# Patient Record
Sex: Male | Born: 1965 | Race: White | Hispanic: No | Marital: Married | Smoking: Never smoker
Health system: Southern US, Community
[De-identification: ages and names within clinical notes are randomized; demographics above are authoritative.]

---

## 2018-02-26 ENCOUNTER — Inpatient Hospital Stay (HOSPITAL_BASED_OUTPATIENT_CLINIC_OR_DEPARTMENT_OTHER)
Admission: EM | Admit: 2018-02-26 | Discharge: 2018-03-01 | DRG: 419 | Disposition: A | Discharge status: Home / Self Care | Payer: Managed Care, Other (non HMO) | Attending: Surgery | Admitting: Surgery

## 2018-02-26 ENCOUNTER — Emergency Department (HOSPITAL_BASED_OUTPATIENT_CLINIC_OR_DEPARTMENT_OTHER): Payer: Managed Care, Other (non HMO)

## 2018-02-26 ENCOUNTER — Other Ambulatory Visit: Payer: Self-pay

## 2018-02-26 ENCOUNTER — Encounter (HOSPITAL_BASED_OUTPATIENT_CLINIC_OR_DEPARTMENT_OTHER): Payer: Self-pay

## 2018-02-26 DIAGNOSIS — R109 Unspecified abdominal pain: Secondary | ICD-10-CM | POA: Diagnosis present

## 2018-02-26 DIAGNOSIS — K8001 Calculus of gallbladder with acute cholecystitis with obstruction: Principal | ICD-10-CM | POA: Diagnosis present

## 2018-02-26 DIAGNOSIS — R52 Pain, unspecified: Secondary | ICD-10-CM

## 2018-02-26 DIAGNOSIS — K8000 Calculus of gallbladder with acute cholecystitis without obstruction: Secondary | ICD-10-CM

## 2018-02-26 DIAGNOSIS — Z9049 Acquired absence of other specified parts of digestive tract: Secondary | ICD-10-CM

## 2018-02-26 DIAGNOSIS — Z419 Encounter for procedure for purposes other than remedying health state, unspecified: Secondary | ICD-10-CM

## 2018-02-26 DIAGNOSIS — R1013 Epigastric pain: Secondary | ICD-10-CM

## 2018-02-26 LAB — CREATININE, SERUM
Creatinine, Ser: 0.76 mg/dL (ref 0.61–1.24)
GFR calc Af Amer: >60 mL/min (ref >60)

## 2018-02-26 LAB — COMPREHENSIVE METABOLIC PANEL
ALBUMIN: 4.1 g/dL (ref 3.5–5.0)
ALT: 13 U/L (ref 0–44)
ANION GAP: 11 (ref 5–15)
AST: 22 U/L (ref 15–41)
Alkaline Phosphatase: 54 U/L (ref 38–126)
BILIRUBIN TOTAL: 0.4 mg/dL (ref 0.3–1.2)
BUN: 14 mg/dL (ref 6–20)
CALCIUM: 9.1 mg/dL (ref 8.9–10.3)
CO2: 26 mmol/L (ref 22–32)
Chloride: 100 mmol/L (ref 98–111)
Creatinine, Ser: 0.93 mg/dL (ref 0.61–1.24)
GFR calc Af Amer: >60 mL/min (ref >60)
GFR calc non Af Amer: >60 mL/min (ref >60)
GLUCOSE: 141 mg/dL — AB (ref 70–99)
POTASSIUM: 3.9 mmol/L (ref 3.5–5.1)
SODIUM: 137 mmol/L (ref 135–145)
TOTAL PROTEIN: 7.7 g/dL (ref 6.5–8.1)

## 2018-02-26 LAB — CBC
HCT: 42.7 % (ref 39.0–52.0)
HEMATOCRIT: 43.7 % (ref 39.0–52.0)
HEMOGLOBIN: 15.3 g/dL (ref 13.0–17.0)
Hemoglobin: 15.1 g/dL (ref 13.0–17.0)
MCH: 30.1 pg (ref 26.0–34.0)
MCH: 30.2 pg (ref 26.0–34.0)
MCHC: 35.4 g/dL (ref 30.0–36.0)
MCHC: 35 g/dL (ref 30.0–36.0)
MCV: 85.2 fL (ref 78.0–100.0)
MCV: 86.2 fL (ref 78.0–100.0)
PLATELETS: 190 10*3/uL (ref 150–400)
Platelets: 198 10*3/uL (ref 150–400)
RBC: 5.01 MIL/uL (ref 4.22–5.81)
RBC: 5.07 MIL/uL (ref 4.22–5.81)
RDW: 12.5 % (ref 11.5–15.5)
RDW: 12.5 % (ref 11.5–15.5)
WBC: 8.4 10*3/uL (ref 4.0–10.5)
WBC: 11.7 10*3/uL — ABNORMAL HIGH (ref 4.0–10.5)

## 2018-02-26 LAB — LIPASE, BLOOD: Lipase: 29 U/L (ref 11–51)

## 2018-02-26 LAB — TROPONIN I: Troponin I: <0.03 ng/mL (ref <0.03)

## 2018-02-26 MED ORDER — SODIUM CHLORIDE 0.9 % IV BOLUS
1000.0000 mL | Freq: Once | INTRAVENOUS | Status: AC
  Administered 2018-02-26: 1000 mL via INTRAVENOUS

## 2018-02-26 MED ORDER — PANTOPRAZOLE SODIUM 40 MG PO TBEC
40.0000 mg | DELAYED_RELEASE_TABLET | Freq: Every day | ORAL | Status: DC
  Administered 2018-02-26: 40 mg via ORAL
  Filled 2018-02-26: qty 1

## 2018-02-26 MED ORDER — IOPAMIDOL (ISOVUE-370) INJECTION 76%
100.0000 mL | Freq: Once | INTRAVENOUS | Status: AC | PRN
  Administered 2018-02-26: 100 mL via INTRAVENOUS

## 2018-02-26 MED ORDER — SIMETHICONE 80 MG PO CHEW: 40.0000 mg | CHEWABLE_TABLET | Freq: Four times a day (QID) | ORAL | Status: DC | PRN

## 2018-02-26 MED ORDER — ONDANSETRON HCL 4 MG/2ML IJ SOLN
4.0000 mg | Freq: Once | INTRAMUSCULAR | Status: AC
  Administered 2018-02-26: 4 mg via INTRAVENOUS
  Filled 2018-02-26: qty 2

## 2018-02-26 MED ORDER — POLYETHYLENE GLYCOL 3350 17 G PO PACK: 17.0000 g | PACK | Freq: Every day | ORAL | Status: DC | PRN

## 2018-02-26 MED ORDER — DIPHENHYDRAMINE HCL 25 MG PO CAPS: 25.0000 mg | ORAL_CAPSULE | Freq: Four times a day (QID) | ORAL | Status: DC | PRN

## 2018-02-26 MED ORDER — HYDROMORPHONE HCL 1 MG/ML IJ SOLN
1.0000 mg | Freq: Once | INTRAMUSCULAR | Status: AC
  Administered 2018-02-26: 1 mg via INTRAVENOUS
  Filled 2018-02-26: qty 1

## 2018-02-26 MED ORDER — KCL IN DEXTROSE-NACL 20-5-0.45 MEQ/L-%-% IV SOLN
INTRAVENOUS | Status: DC
  Administered 2018-02-26 – 2018-02-27 (×3): via INTRAVENOUS
  Filled 2018-02-26 (×3): qty 1000

## 2018-02-26 MED ORDER — HYDRALAZINE HCL 20 MG/ML IJ SOLN: 10.0000 mg | INTRAMUSCULAR | Status: DC | PRN

## 2018-02-26 MED ORDER — HYDROMORPHONE HCL 1 MG/ML IJ SOLN
0.5000 mg | Freq: Once | INTRAMUSCULAR | Status: AC
  Administered 2018-02-26: 0.5 mg via INTRAVENOUS
  Filled 2018-02-26: qty 1

## 2018-02-26 MED ORDER — ACETAMINOPHEN 325 MG PO TABS: 650.0000 mg | ORAL_TABLET | Freq: Four times a day (QID) | ORAL | Status: DC | PRN

## 2018-02-26 MED ORDER — HYDROMORPHONE HCL 1 MG/ML IJ SOLN
0.5000 mg | INTRAMUSCULAR | Status: DC | PRN
  Administered 2018-02-26: 1 mg via INTRAVENOUS
  Administered 2018-02-26: 0.5 mg via INTRAVENOUS
  Filled 2018-02-26 (×2): qty 1

## 2018-02-26 MED ORDER — ACETAMINOPHEN 650 MG RE SUPP: 650.0000 mg | Freq: Four times a day (QID) | RECTAL | Status: DC | PRN

## 2018-02-26 MED ORDER — ONDANSETRON HCL 4 MG/2ML IJ SOLN: 4.0000 mg | Freq: Four times a day (QID) | INTRAMUSCULAR | Status: DC | PRN

## 2018-02-26 MED ORDER — OXYCODONE HCL 5 MG PO TABS: 5.0000 mg | ORAL_TABLET | ORAL | Status: DC | PRN

## 2018-02-26 MED ORDER — DIPHENHYDRAMINE HCL 50 MG/ML IJ SOLN: 25.0000 mg | Freq: Four times a day (QID) | INTRAMUSCULAR | Status: DC | PRN

## 2018-02-26 MED ORDER — ZOLPIDEM TARTRATE 5 MG PO TABS: 5.0000 mg | ORAL_TABLET | Freq: Every evening | ORAL | Status: DC | PRN

## 2018-02-26 MED ORDER — ONDANSETRON 4 MG PO TBDP: 4.0000 mg | ORAL_TABLET | Freq: Four times a day (QID) | ORAL | Status: DC | PRN

## 2018-02-26 MED ORDER — ENOXAPARIN SODIUM 40 MG/0.4ML ~~LOC~~ SOLN
40.0000 mg | SUBCUTANEOUS | Status: DC
  Administered 2018-02-27: 40 mg via SUBCUTANEOUS
  Filled 2018-02-26: qty 0.4

## 2018-02-26 NOTE — ED Notes (Signed)
ED TO INPATIENT HANDOFF REPORT  Name/Age/Gender Mark Dominguez 52 y.o. male  Code Status   Home/SNF/Other Home  Chief Complaint UPPER ABD PAIN  Level of Care/Admitting Diagnosis ED Disposition    ED Disposition Condition [facility]: [place] [100102]  Level of Care: Med-Surg [16]  Diagnosis: Abdominal pain [409811]  Admitting Physician: [place], [place]  Attending Physician: CCS, MD [[id number]]  Estimated length of stay: past midnight tomorrow  Certification:: I certify this patient will need inpatient services for at least 2 midnights  PT Class (Do Not Modify): Inpatient [101]  PT Acc Code (Do Not Modify): Private [1]       Medical History History reviewed. No pertinent past medical history.  Allergies No Known Allergies  IV Location/Drains/Wounds Patient Lines/Drains/Airways Status   Active Line/Drains/Airways    Name:   Placement date:   Placement time:   Site:   Days:   Peripheral IV 02/26/18 Right Antecubital   02/26/18    0942    Antecubital   less than 1          Labs/Imaging Results for orders placed or performed during the hospital encounter of 02/26/18 (from the past 48 hour(s))  CBC     Status: None   Collection Time: 02/26/18  9:39 AM  Result Value Ref Range   WBC 8.4 4.0 - 10.5 K/uL   RBC 5.01 4.22 - 5.81 MIL/uL   Hemoglobin 15.1 13.0 - 17.0 g/dL   HCT 42.7 39.0 - 52.0 %   MCV 85.2 78.0 - 100.0 fL   MCH 30.1 26.0 - 34.0 pg   MCHC 35.4 30.0 - 36.0 g/dL   RDW 12.5 11.5 - 15.5 %   Platelets 190 150 - 400 K/uL    Comment: Performed at [facility], [address] [place], [place] [address]  Troponin I     Status: None   Collection Time: 02/26/18  9:39 AM  Result Value Ref Range   Troponin I <0.03 <0.03 ng/mL    Comment: Performed at [facility], [address] [place], [place] [address]  Comprehensive metabolic panel     Status: Abnormal   Collection Time: 02/26/18  9:39 AM   Result Value Ref Range   Sodium 137 135 - 145 mmol/L   Potassium 3.9 3.5 - 5.1 mmol/L   Chloride 100 98 - 111 mmol/L   CO2 26 22 - 32 mmol/L   Glucose, Bld 141 (H) 70 - 99 mg/dL   BUN 14 6 - 20 mg/dL   Creatinine, Ser 0.93 0.61 - 1.24 mg/dL   Calcium 9.1 8.9 - 10.3 mg/dL   Total Protein 7.7 6.5 - 8.1 g/dL   Albumin 4.1 3.5 - 5.0 g/dL   AST 22 15 - 41 U/L   ALT 13 0 - 44 U/L   Alkaline Phosphatase 54 38 - 126 U/L   Total Bilirubin 0.4 0.3 - 1.2 mg/dL   GFR calc non Af Amer >60 >60 mL/min   GFR calc Af Amer >60 >60 mL/min    Comment: (NOTE) The eGFR has been calculated using the CKD EPI equation. This calculation has not been validated in all clinical situations. eGFR's persistently <60 mL/min signify possible Chronic Kidney Disease.    Anion gap 11 5 - 15    Comment: Performed at [facility], [address] [place], [place] [address]  Lipase, blood     Status: None  Collection Time: 02/26/18  9:39 AM  Result Value Ref Range   Lipase 29 11 - 51 U/L    Comment: Performed at [facility], [address], [place], [place] [address]   Dg Chest 2 View  Result Date: 02/26/2018 CLINICAL DATA:  Upper abdominal pain radiating to back, epigastric pain. EXAM: CHEST - 2 VIEW COMPARISON:  None. FINDINGS: Linear density at the left lung base, likely lingular scarring or atelectasis. Right lung clear. Heart is normal size. No effusions or acute bony abnormality. IMPRESSION: Minimal left basilar scarring or atelectasis. No acute cardiopulmonary disease. Electronically Signed   By: Rolm Baptise M.D.   On: 02/26/2018 09:45   Ct Angio Chest/abd/pel For Dissection W And/or Wo Contrast  Result Date: 02/26/2018 CLINICAL DATA:  Epigastric chest/abdominal pains that started this morning radiating through to his back, patient is very clammy and appears diaphoretic, no known chest or abdominal/pelvic history, no other complaints^ EXAM: CT ANGIOGRAPHY CHEST, ABDOMEN AND PELVIS  TECHNIQUE: Multidetector CT imaging through the chest, abdomen and pelvis was performed using the standard protocol during bolus administration of intravenous contrast. Multiplanar reconstructed images and MIPs were obtained and reviewed to evaluate the vascular anatomy. CONTRAST:  181m ISOVUE-370 IOPAMIDOL (ISOVUE-370) INJECTION 76% COMPARISON:  None available FINDINGS: CTA CHEST FINDINGS Cardiovascular: Heart size normal. No pericardial effusion. Satisfactory opacification of pulmonary arteries noted, and there is no evidence of pulmonary emboli. No hyperdense crescent involving the aorta on the noncontrast study. Adequate contrast opacification of the thoracic aorta with no evidence of dissection, aneurysm, or stenosis. There is classic 3-vessel brachiocephalic arch anatomy without proximal stenosis. No significant atheromatous plaque. Mediastinum/Nodes: No hilar or mediastinal adenopathy. Lungs/Pleura: No pleural effusion. No pneumothorax. Minimal dependent atelectasis posteriorly in both lower lobes. Lungs otherwise clear. Musculoskeletal: No chest wall abnormality. No acute or significant osseous findings. Review of the MIP images confirms the above findings. CTA ABDOMEN AND PELVIS FINDINGS VASCULAR Aorta: Normal caliber aorta without aneurysm, dissection, vasculitis or significant stenosis. Celiac: Patent without evidence of aneurysm, dissection, vasculitis or significant stenosis. SMA: Patent without evidence of aneurysm, dissection, vasculitis or significant stenosis. Renals: Both renal arteries are patent without evidence of aneurysm, dissection, vasculitis, fibromuscular dysplasia or significant stenosis. IMA: Patent without evidence of aneurysm, dissection, vasculitis or significant stenosis. Inflow: Patent without evidence of aneurysm, dissection, vasculitis or significant stenosis. Veins: No obvious venous abnormality within the limitations of this arterial phase study. Review of the MIP images  confirms the above findings. NON-VASCULAR Hepatobiliary: Unremarkable liver. At least 2 partially calcified stones measuring over 1 cm in the dependent aspect of the nondilated gallbladder. No biliary ductal dilatation. Pancreas: Unremarkable. No pancreatic ductal dilatation or surrounding inflammatory changes. Spleen: Normal in size without focal abnormality. Adrenals/Urinary Tract: Adrenal glands are unremarkable. Kidneys are normal, without renal calculi, focal lesion, or hydronephrosis. Bladder is unremarkable. Stomach/Bowel: Stomach is physiologically distended. The small bowel is nondilated. Normal appendix. Colon is nondilated, unremarkable. Lymphatic: No abdominal or pelvic adenopathy. Reproductive: Prostatic enlargement Other: No ascites.  No free air. Musculoskeletal: Degenerative disc disease L4-5, L5-S1. No fracture or worrisome bone lesion. Review of the MIP images confirms the above findings. IMPRESSION: 1. Negative for aortic dissection or other acute vascular finding. 2. Cholelithiasis without other CT evidence of cholecystitis or biliary obstruction. 3. Lumbar spondylitic changes Electronically Signed   By: DLucrezia EuropeM.D.   On: 02/26/2018 11:01   UKoreaAbdomen Limited Ruq  Result Date: 02/26/2018 CLINICAL DATA:  Epigastric abdominal pain. The patient  is found to have gallstones on CT scan today. EXAM: ULTRASOUND ABDOMEN LIMITED RIGHT UPPER QUADRANT COMPARISON:  CT scan of the chest of today's date FINDINGS: Gallbladder: The gallbladder is adequately distended. There is a gallstone present measuring 1.7 cm in diameter. There is also sludge. There is a positive sonographic Murphy's sign. The gallbladder wall thickness is normal at 2.5 mm. There is no pericholecystic fluid. Common bile duct: Diameter: 3.4 mm Liver: Hepatic echotexture is compatible with fatty infiltrative change. Focal fatty sparing adjacent to the gallbladder is suspected. There is no intrahepatic ductal dilation. Portal vein is  patent on color Doppler imaging with normal direction of blood flow towards the liver. IMPRESSION: Gallstones and sludge. Positive sonographic Murphy's sign. The findings are consistent with cholelithiasis and acute cholecystitis. Increased hepatic echotexture most compatible with fatty infiltrative changes. These results will be called to the ordering clinician or representative by the Radiologist Assistant, and communication documented in the PACS or zVision Dashboard. Electronically Signed   By: David  Martinique M.D.   On: 02/26/2018 12:05    Pending Labs [employer] (From admission, onward)    Start     Ordered   Signed and Held  HIV antibody (Routine Testing)  Once,   R     Signed and Held   Signed and Held  CBC  (enoxaparin (LOVENOX)    CrCl >/= 30 ml/min)  Once,   R    Comments:  Baseline for enoxaparin therapy IF NOT ALREADY DRAWN.  Notify MD if PLT < 100 K.    Signed and Held   Signed and Held  Creatinine, serum  (enoxaparin (LOVENOX)    CrCl >/= 30 ml/min)  Once,   R    Comments:  Baseline for enoxaparin therapy IF NOT ALREADY DRAWN.    Signed and Held   Signed and Held  Creatinine, serum  (enoxaparin (LOVENOX)    CrCl >/= 30 ml/min)  Weekly,   R    Comments:  while on enoxaparin therapy    Signed and Held   Signed and Held  Comprehensive metabolic panel  Tomorrow morning,   R     Signed and Held   Signed and Held  CBC  Tomorrow morning,   R     Signed and Held          Vitals/Pain Today's Vitals   02/26/18 1357 02/26/18 1400 02/26/18 1430 02/26/18 1500  BP: (!) 141/91 (!) 144/84 (!) 143/91 (!) 144/82  Pulse: (!) 59 61 78 64  Resp: _0 Temp: 98.5 F (36.9 C)     TempSrc: Oral     SpO2: 97% 96% 95% 97%  Weight:      Height:      PainSc:        Isolation Precautions No active isolations  Medications Medications  HYDROmorphone (DILAUDID) injection 1 mg (1 mg Intravenous Given 02/26/18 1009)  ondansetron (ZOFRAN) injection 4 mg (4 mg Intravenous Given  02/26/18 1008)  sodium chloride 0.9 % bolus 1,000 mL ( Intravenous Stopped 02/26/18 1125)  iopamidol (ISOVUE-370) 76 % injection 100 mL (100 mLs Intravenous Contrast Given 02/26/18 1037)  HYDROmorphone (DILAUDID) injection 0.5 mg (0.5 mg Intravenous Given 02/26/18 1120)  HYDROmorphone (DILAUDID) injection 0.5 mg (0.5 mg Intravenous Given 02/26/18 1500)    Mobility walks

## 2018-02-26 NOTE — ED Provider Notes (Signed)
Received [facility] in [facility].  He was evaluated there earlier today for sudden onset of epigastric pain.  Work-up revealed cholelithiasis with evidence of acute cholecystitis. On exam, alert and comfortable, epigastric tenderness without peritonitis. Paged general surgery upon patient's arrival.  Gave IV pain medication.  4:04 PM  Surgery has evaluated and will admit for further work up.   Gurbani Figge, Ambrose Finlandachel Morgan, MD 02/26/18 [phone]

## 2018-02-26 NOTE — ED Notes (Signed)
Report given to [facility]. ETA 20 mins 

## 2018-02-26 NOTE — ED Notes (Signed)
Report called to [facility] - Susy FrizzleMatt, RN

## 2018-02-26 NOTE — ED Notes (Signed)
Transporter called to transport patient to IP room

## 2018-02-26 NOTE — H&P (Signed)
Mark Dominguez 10/09/1965  [id number].    Chief Complaint/Reason for Consult: epigastric abdominal pain  HPI:  This is a 52 year old white male with no past medical history who was awakened from sleep this morning at oh 5:30 AM secondary to sharp stabbing epigastric abdominal pain that radiated through to his back.  He did have some nausea and one episode of emesis.  He drove to the store where he got Tums.  He took these and it did not help his pain.  Given his persistent pain he drove himself to [facility] emergency department.  Upon arrival later, his labs were found to be normal.  He had a CT angiogram of his chest, abdomen, and pelvis completed to rule out aortic dissection.  This imaging was normal with an incidental finding of a gallstone.  Given his symptoms and ultrasound was then obtained.  This ultrasound revealed a 1.7 cm gallstone in his gallbladder.  This was not lodged in the neck of the gallbladder.  There was no evidence of pericholecystic fluid or gallbladder wall thickening to suggest cholecystitis.  The patient was then transferred to [facility] emergency department at our request for further evaluation.  The patient states he did have a bowel movement this morning that was otherwise normal.  He denies any history of gastric reflux or ulcer disease.  He states he does not take any medications at home.  He denies any chest pain or shortness of breath.  ROS: ROS: Please see HPI, otherwise all other systems have been reviewed and are negative.  History reviewed. No pertinent family history.  History reviewed. No pertinent past medical history.  History reviewed. No pertinent surgical history.  Social History:  reports that he has never smoked. He has never used smokeless tobacco. He reports that he does not drink alcohol or use drugs.  Allergies: No Known Allergies   (Not in a hospital admission)   Physical Exam: Blood pressure (!) 144/82, pulse 64,  temperature 98.5 F (36.9 C), temperature source Oral, resp. rate 17, height _0  (1.93 m), weight 117.9 kg, SpO2 97 %. General: pleasant, WD, WN, obese white male who is laying in bed in NAD as he has recently had Dilaudid for his pain. HEENT: head is normocephalic, atraumatic.  Sclera are noninjected.  PERRL.  Ears and nose without any masses or lesions.  Mouth is pink and moist Heart: regular, rate, and rhythm.  Normal s1,s2. No obvious murmurs, gallops, or rubs noted.  Palpable radial and pedal pulses bilaterally Lungs: CTAB, no wheezes, rhonchi, or rales noted.  Respiratory effort nonlabored Abd: soft, mild tenderness to deep palpation in his epigastrium, negative Murphy sign, ND/obese, +BS, no masses, hernias, or organomegaly MS: all 4 extremities are symmetrical with no cyanosis, clubbing.  He has +1 to +2 pitting edema in his bilateral lower extremities Skin: warm and dry with no masses, lesions, or rashes.  He has early brawny color changes to his bilateral lower extremities likely secondary to early venous insufficiency changes. Psych: A&Ox3 with an appropriate affect.   Results for orders placed or performed during the hospital encounter of 02/26/18 (from the past 48 hour(s))  CBC     Status: None   Collection Time: 02/26/18  9:39 AM  Result Value Ref Range   WBC 8.4 4.0 - 10.5 K/uL   RBC 5.01 4.22 - 5.81 MIL/uL   Hemoglobin 15.1 13.0 - 17.0 g/dL   HCT 42.7 39.0 - 52.0 %  MCV 85.2 78.0 - 100.0 fL   MCH 30.1 26.0 - 34.0 pg   MCHC 35.4 30.0 - 36.0 g/dL   RDW 12.5 11.5 - 15.5 %   Platelets 190 150 - 400 K/uL    Comment: Performed at [facility], [address] [place], [place] [address]  Troponin I     Status: None   Collection Time: 02/26/18  9:39 AM  Result Value Ref Range   Troponin I <0.03 <0.03 ng/mL    Comment: Performed at [facility], [address] [place], [place] [address]  Comprehensive metabolic panel     Status: Abnormal   Collection  Time: 02/26/18  9:39 AM  Result Value Ref Range   Sodium 137 135 - 145 mmol/L   Potassium 3.9 3.5 - 5.1 mmol/L   Chloride 100 98 - 111 mmol/L   CO2 26 22 - 32 mmol/L   Glucose, Bld 141 (H) 70 - 99 mg/dL   BUN 14 6 - 20 mg/dL   Creatinine, Ser 0.93 0.61 - 1.24 mg/dL   Calcium 9.1 8.9 - 10.3 mg/dL   Total Protein 7.7 6.5 - 8.1 g/dL   Albumin 4.1 3.5 - 5.0 g/dL   AST 22 15 - 41 U/L   ALT 13 0 - 44 U/L   Alkaline Phosphatase 54 38 - 126 U/L   Total Bilirubin 0.4 0.3 - 1.2 mg/dL   GFR calc non Af Amer >60 >60 mL/min   GFR calc Af Amer >60 >60 mL/min    Comment: (NOTE) The eGFR has been calculated using the CKD EPI equation. This calculation has not been validated in all clinical situations. eGFR's persistently <60 mL/min signify possible Chronic Kidney Disease.    Anion gap 11 5 - 15    Comment: Performed at [facility], [address] [place], [place] [address]  Lipase, blood     Status: None   Collection Time: 02/26/18  9:39 AM  Result Value Ref Range   Lipase 29 11 - 51 U/L    Comment: Performed at [facility], [address], [place], [place] [address]   Dg Chest 2 View  Result Date: 02/26/2018 CLINICAL DATA:  Upper abdominal pain radiating to back, epigastric pain. EXAM: CHEST - 2 VIEW COMPARISON:  None. FINDINGS: Linear density at the left lung base, likely lingular scarring or atelectasis. Right lung clear. Heart is normal size. No effusions or acute bony abnormality. IMPRESSION: Minimal left basilar scarring or atelectasis. No acute cardiopulmonary disease. Electronically Signed   By: Rolm Baptise M.D.   On: 02/26/2018 09:45   Ct Angio Chest/abd/pel For Dissection W And/or Wo Contrast  Result Date: 02/26/2018 CLINICAL DATA:  Epigastric chest/abdominal pains that started this morning radiating through to his back, patient is very clammy and appears diaphoretic, no known chest or abdominal/pelvic history, no other complaints^ EXAM: CT ANGIOGRAPHY CHEST,  ABDOMEN AND PELVIS TECHNIQUE: Multidetector CT imaging through the chest, abdomen and pelvis was performed using the standard protocol during bolus administration of intravenous contrast. Multiplanar reconstructed images and MIPs were obtained and reviewed to evaluate the vascular anatomy. CONTRAST:  170m ISOVUE-370 IOPAMIDOL (ISOVUE-370) INJECTION 76% COMPARISON:  None available FINDINGS: CTA CHEST FINDINGS Cardiovascular: Heart size normal. No pericardial effusion. Satisfactory opacification of pulmonary arteries noted, and there is no evidence of pulmonary emboli. No hyperdense crescent involving the aorta on the noncontrast study. Adequate contrast opacification of the thoracic aorta with no evidence  of dissection, aneurysm, or stenosis. There is classic 3-vessel brachiocephalic arch anatomy without proximal stenosis. No significant atheromatous plaque. Mediastinum/Nodes: No hilar or mediastinal adenopathy. Lungs/Pleura: No pleural effusion. No pneumothorax. Minimal dependent atelectasis posteriorly in both lower lobes. Lungs otherwise clear. Musculoskeletal: No chest wall abnormality. No acute or significant osseous findings. Review of the MIP images confirms the above findings. CTA ABDOMEN AND PELVIS FINDINGS VASCULAR Aorta: Normal caliber aorta without aneurysm, dissection, vasculitis or significant stenosis. Celiac: Patent without evidence of aneurysm, dissection, vasculitis or significant stenosis. SMA: Patent without evidence of aneurysm, dissection, vasculitis or significant stenosis. Renals: Both renal arteries are patent without evidence of aneurysm, dissection, vasculitis, fibromuscular dysplasia or significant stenosis. IMA: Patent without evidence of aneurysm, dissection, vasculitis or significant stenosis. Inflow: Patent without evidence of aneurysm, dissection, vasculitis or significant stenosis. Veins: No obvious venous abnormality within the limitations of this arterial phase study. Review of  the MIP images confirms the above findings. NON-VASCULAR Hepatobiliary: Unremarkable liver. At least 2 partially calcified stones measuring over 1 cm in the dependent aspect of the nondilated gallbladder. No biliary ductal dilatation. Pancreas: Unremarkable. No pancreatic ductal dilatation or surrounding inflammatory changes. Spleen: Normal in size without focal abnormality. Adrenals/Urinary Tract: Adrenal glands are unremarkable. Kidneys are normal, without renal calculi, focal lesion, or hydronephrosis. Bladder is unremarkable. Stomach/Bowel: Stomach is physiologically distended. The small bowel is nondilated. Normal appendix. Colon is nondilated, unremarkable. Lymphatic: No abdominal or pelvic adenopathy. Reproductive: Prostatic enlargement Other: No ascites.  No free air. Musculoskeletal: Degenerative disc disease L4-5, L5-S1. No fracture or worrisome bone lesion. Review of the MIP images confirms the above findings. IMPRESSION: 1. Negative for aortic dissection or other acute vascular finding. 2. Cholelithiasis without other CT evidence of cholecystitis or biliary obstruction. 3. Lumbar spondylitic changes Electronically Signed   By: Lucrezia Europe M.D.   On: 02/26/2018 11:01   US Abdomen Limited Ruq  Result Date: 02/26/2018 CLINICAL DATA:  Epigastric abdominal pain. The patient is found to have gallstones on CT scan today. EXAM: ULTRASOUND ABDOMEN LIMITED RIGHT UPPER QUADRANT COMPARISON:  CT scan of the chest of today's date FINDINGS: Gallbladder: The gallbladder is adequately distended. There is a gallstone present measuring 1.7 cm in diameter. There is also sludge. There is a positive sonographic Murphy's sign. The gallbladder wall thickness is normal at 2.5 mm. There is no pericholecystic fluid. Common bile duct: Diameter: 3.4 mm Liver: Hepatic echotexture is compatible with fatty infiltrative change. Focal fatty sparing adjacent to the gallbladder is suspected. There is no intrahepatic ductal dilation.  Portal vein is patent on color Doppler imaging with normal direction of blood flow towards the liver. IMPRESSION: Gallstones and sludge. Positive sonographic Murphy's sign. The findings are consistent with cholelithiasis and acute cholecystitis. Increased hepatic echotexture most compatible with fatty infiltrative changes. These results will be called to the ordering clinician or representative by the Radiologist Assistant, and communication documented in the PACS or zVision Dashboard. Electronically Signed   By: David  Martinique M.D.   On: 02/26/2018 12:05      Assessment/Plan Epigastric abdominal pain The patient woke up this morning with severe epigastric abdominal pain that was stabbing in nature through to his back.  He has had a CT of his chest, abdomen, and pelvis with no significant findings except for a gallstone.  His ultrasound reveals a 1.7 cm gallstone; however, this is not lodged in the neck of his gallbladder.  There are no other signs of acute cholecystitis such as pericholecystic fluid, wall thickening, fever,  or elevated white blood cell count.  The patient could simply have biliary colic, however the expectation would be that this would likely improve after pain medication.  Given his persistent pain, despite no radiologic or laboratory findings for infection, we will admit the patient and obtain a HIDA scan to rule out cholecystitis.  If this is negative for cholecystitis, we will likely obtain a GI consultation to rule out other sources for pain such as ulcer disease or gastritis.  If after gastroenterology consultation and possible EGD were obtained and otherwise negative as well, we would then offer a cholecystectomy with the possibility this may not resolve his pain.  He may have clear liquids tonight as well as pain medicine.  After midnight he will be n.p.o. with no further narcotics until after his HIDA scan.  We will also repeat labs in the morning.  As of now, we will not start  antibiotic therapy as he does not have any clear evidence for an acute infection.  This has been discussed with the patient and he is agreeable to this plan.  FEN - clear liquids, NPO p MN VTE - Lovenox/SCDs ID - none  Henreitta Cea, [facility] Surgery 02/26/2018, 4:14 PM Pager: [phone]

## 2018-02-26 NOTE — ED Provider Notes (Signed)
[facility] EMERGENCY DEPARTMENT Provider Note   CSN: [id number] Arrival date & time: 02/26/18  1191     History   Chief Complaint Chief Complaint  Patient presents with  . Abdominal Pain    HPI Mark Dominguez is a 52 y.o. male.  HPI  52 year old male presents with severe epigastric abdominal pain.  Started this morning at 5:30 AM and woke him from sleep.  He also has some pain in his back at a similar level.  Has had nausea without vomiting.  Has felt flushed but has not had an obvious fever.  No chest pain or shortness of breath.  No constipation or diarrhea.  He tried some Tums without relief.  Has never had this before.  Denies any significant past medical history including no smoking, alcohol use, or drug use.  History reviewed. No pertinent past medical history.  There are no active problems to display for this patient.   History reviewed. No pertinent surgical history.      Home Medications    Prior to Admission medications   Not on File    Family History No family history on file.  Social History Social History   Tobacco Use  . Smoking status: Never Smoker  . Smokeless tobacco: Never Used  Substance Use Topics  . Alcohol use: Never    Frequency: Never  . Drug use: Never     Allergies   Patient has no known allergies.   Review of Systems Review of Systems  Constitutional: Negative for chills and fever.  Respiratory: Negative for shortness of breath.   Cardiovascular: Negative for chest pain.  Gastrointestinal: Positive for abdominal pain and nausea. Negative for constipation, diarrhea and vomiting.  Musculoskeletal: Positive for back pain.  All other systems reviewed and are negative.    Physical Exam Updated Vital Signs BP (!) 142/82   Pulse (!) 55   Temp 98 F (36.7 C) (Oral)   Resp 13   Ht 6\' 4"  (1.93 m)   Wt 117.9 kg   SpO2 95%   BMI 31.65 kg/m   Physical Exam  Constitutional: He is oriented to person, place, and  time. He appears well-developed and well-nourished.  Non-toxic appearance. He does not appear ill.  HENT:  Head: Normocephalic and atraumatic.  Right Ear: External ear normal.  Left Ear: External ear normal.  Nose: Nose normal.  Eyes: Right eye exhibits no discharge. Left eye exhibits no discharge.  Neck: Neck supple.  Cardiovascular: Normal rate, regular rhythm and normal heart sounds.  Pulses:      Radial pulses are 2+ on the right side, and 2+ on the left side.  Pulmonary/Chest: Effort normal and breath sounds normal. He exhibits no tenderness.  Abdominal: Soft. There is tenderness (mild) in the right upper quadrant and epigastric area.  Musculoskeletal: He exhibits no edema.  Neurological: He is alert and oriented to person, place, and time.  Skin: Skin is warm and dry.  Nursing note and vitals reviewed.    ED Treatments / Results  Labs (all labs ordered are listed, but only abnormal results are displayed) Labs Reviewed  COMPREHENSIVE METABOLIC PANEL - Abnormal; Notable for the following components:      Result Value   Glucose, Bld 141 (*)    All other components within normal limits  CBC  TROPONIN I  LIPASE, BLOOD    EKG EKG Interpretation  Date/Time:  Monday February 26 2018 09:28:10 EDT Ventricular Rate:  57 PR Interval:  QRS Duration: 100 QT Interval:  397 QTC Calculation: 387 R Axis:   63 Text Interpretation:  Sinus rhythm Baseline wander in lead(s) V4 no acute ST/T changes No old tracing to compare Confirmed by Pricilla Loveless [phone]) on 02/26/2018 9:33:57 AM Also confirmed by Pricilla Loveless [phone]), editor Barbette Hair [phone])  on 02/26/2018 11:09:58 AM   Radiology Dg Chest 2 View  Result Date: 02/26/2018 CLINICAL DATA:  Upper abdominal pain radiating to back, epigastric pain. EXAM: CHEST - 2 VIEW COMPARISON:  None. FINDINGS: Linear density at the left lung base, likely lingular scarring or atelectasis. Right lung clear. Heart is normal size. No  effusions or acute bony abnormality. IMPRESSION: Minimal left basilar scarring or atelectasis. No acute cardiopulmonary disease. Electronically Signed   By: Charlett Nose M.D.   On: 02/26/2018 09:45   Ct Angio Chest/abd/pel For Dissection W And/or Wo Contrast  Result Date: 02/26/2018 CLINICAL DATA:  Epigastric chest/abdominal pains that started this morning radiating through to his back, patient is very clammy and appears diaphoretic, no known chest or abdominal/pelvic history, no other complaints^ EXAM: CT ANGIOGRAPHY CHEST, ABDOMEN AND PELVIS TECHNIQUE: Multidetector CT imaging through the chest, abdomen and pelvis was performed using the standard protocol during bolus administration of intravenous contrast. Multiplanar reconstructed images and MIPs were obtained and reviewed to evaluate the vascular anatomy. CONTRAST:  ISOVUE-370 IOPAMIDOL (ISOVUE-370) INJECTION 76% COMPARISON:  None available FINDINGS: CTA CHEST FINDINGS Cardiovascular: Heart size normal. No pericardial effusion. Satisfactory opacification of pulmonary arteries noted, and there is no evidence of pulmonary emboli. No hyperdense crescent involving the aorta on the noncontrast study. Adequate contrast opacification of the thoracic aorta with no evidence of dissection, aneurysm, or stenosis. There is classic 3-vessel brachiocephalic arch anatomy without proximal stenosis. No significant atheromatous plaque. Mediastinum/Nodes: No hilar or mediastinal adenopathy. Lungs/Pleura: No pleural effusion. No pneumothorax. Minimal dependent atelectasis posteriorly in both lower lobes. Lungs otherwise clear. Musculoskeletal: No chest wall abnormality. No acute or significant osseous findings. Review of the MIP images confirms the above findings. CTA ABDOMEN AND PELVIS FINDINGS VASCULAR Aorta: Normal caliber aorta without aneurysm, dissection, vasculitis or significant stenosis. Celiac: Patent without evidence of aneurysm, dissection, vasculitis or  significant stenosis. SMA: Patent without evidence of aneurysm, dissection, vasculitis or significant stenosis. Renals: Both renal arteries are patent without evidence of aneurysm, dissection, vasculitis, fibromuscular dysplasia or significant stenosis. IMA: Patent without evidence of aneurysm, dissection, vasculitis or significant stenosis. Inflow: Patent without evidence of aneurysm, dissection, vasculitis or significant stenosis. Veins: No obvious venous abnormality within the limitations of this arterial phase study. Review of the MIP images confirms the above findings. NON-VASCULAR Hepatobiliary: Unremarkable liver. At least 2 partially calcified stones measuring over 1 cm in the dependent aspect of the nondilated gallbladder. No biliary ductal dilatation. Pancreas: Unremarkable. No pancreatic ductal dilatation or surrounding inflammatory changes. Spleen: Normal in size without focal abnormality. Adrenals/Urinary Tract: Adrenal glands are unremarkable. Kidneys are normal, without renal calculi, focal lesion, or hydronephrosis. Bladder is unremarkable. Stomach/Bowel: Stomach is physiologically distended. The small bowel is nondilated. Normal appendix. Colon is nondilated, unremarkable. Lymphatic: No abdominal or pelvic adenopathy. Reproductive: Prostatic enlargement Other: No ascites.  No free air. Musculoskeletal: Degenerative disc disease L4-5, L5-S1. No fracture or worrisome bone lesion. Review of the MIP images confirms the above findings. IMPRESSION: 1. Negative for aortic dissection or other acute vascular finding. 2. Cholelithiasis without other CT evidence of cholecystitis or biliary obstruction. 3. Lumbar spondylitic changes Electronically Signed   By: Ronald Pippins.D.  On: 02/26/2018 11:01   [place] Abdomen Limited Ruq  Result Date: 02/26/2018 CLINICAL DATA:  Epigastric abdominal pain. The patient is found to have gallstones on CT scan today. EXAM: ULTRASOUND ABDOMEN LIMITED RIGHT UPPER QUADRANT  COMPARISON:  CT scan of the chest of today's date FINDINGS: Gallbladder: The gallbladder is adequately distended. There is a gallstone present measuring 1.7 cm in diameter. There is also sludge. There is a positive sonographic Murphy's sign. The gallbladder wall thickness is normal at 2.5 mm. There is no pericholecystic fluid. Common bile duct: Diameter: 3.4 mm Liver: Hepatic echotexture is compatible with fatty infiltrative change. Focal fatty sparing adjacent to the gallbladder is suspected. There is no intrahepatic ductal dilation. Portal vein is patent on color Doppler imaging with normal direction of blood flow towards the liver. IMPRESSION: Gallstones and sludge. Positive sonographic Murphy's sign. The findings are consistent with cholelithiasis and acute cholecystitis. Increased hepatic echotexture most compatible with fatty infiltrative changes. These results will be called to the ordering clinician or representative by the Radiologist Assistant, and communication documented in the PACS or zVision Dashboard. Electronically Signed   By: David  Swazil[place] M.D.   On: 02/26/2018 12:05    Procedures Procedures (including critical care time)  Medications Ordered in ED Medications  HYDROmorphone (DILAUDID) injection 1 mg (1 mg Intravenous Given 02/26/18 1009)  ondansetron (ZOFRAN) injection 4 mg (4 mg Intravenous Given 02/26/18 1008)  sodium chloride 0.9 % bolus 1,000 mL ( Intravenous Stopped 02/26/18 1125)  iopamidol (ISOVUE-370) 76 % injection 100 mL (100 mLs Intravenous Contrast Given 02/26/18 1037)  HYDROmorphone (DILAUDID) injection 0.5 mg (0.5 mg Intravenous Given 02/26/18 1120)     Initial Impression / Assessment and Plan / ED Course  I have reviewed the triage vital signs and the nursing notes.  Pertinent labs & imaging results that were available during my care of the patient were reviewed by me and considered in my medical decision making (see chart for details).     Patient originally  worked up for aortic dissection given severe pain and pain in the back.  IV Dilaudid has helped his pain a little.  Given the negative CT, right upper quadrant ultrasound was obtained.  This shows cholecystitis given the sonographic Murphy sign.  He also has a large gallstone which I think is the primary cause for why he is hurting so bad.  His labs are reassuring but given his poor pain control with multiple doses of IV Dilaudid I think he needs surgical consultation.  Discussed with Barnetta ChapelKelly Osborne via [facility], and [facility] for her to evaluate.  No antibiotics at this time.  Discussed with the EDP, Dr. Rodena MedinMessick, who accepts in transfer.  Final Clinical Impressions(s) / ED Diagnoses   Final diagnoses:  Epigastric abdominal pain  Calculus of gallbladder with acute cholecystitis without obstruction    ED Discharge Orders    None       Pricilla LovelessGoldston, Jadene Stemmer, MD 02/26/18 1244

## 2018-02-26 NOTE — ED Triage Notes (Signed)
Pt reports this morning he started having upper mid abdominal pain. Pt reports it feels like it shoots through to his back. Pt denies any cardiac history or abdominal issues.

## 2018-02-26 NOTE — ED Notes (Signed)
Patient transported to Ultrasound 

## 2018-02-26 NOTE — ED Notes (Signed)
Surgery consult at bedside.

## 2018-02-27 ENCOUNTER — Inpatient Hospital Stay (HOSPITAL_COMMUNITY): Payer: Managed Care, Other (non HMO)

## 2018-02-27 ENCOUNTER — Encounter (HOSPITAL_COMMUNITY): Payer: Self-pay

## 2018-02-27 LAB — CBC
HCT: 41.1 % (ref 39.0–52.0)
HEMOGLOBIN: 14.2 g/dL (ref 13.0–17.0)
MCH: 30 pg (ref 26.0–34.0)
MCHC: 34.5 g/dL (ref 30.0–36.0)
MCV: 86.9 fL (ref 78.0–100.0)
Platelets: 169 10*3/uL (ref 150–400)
RBC: 4.73 MIL/uL (ref 4.22–5.81)
RDW: 12.5 % (ref 11.5–15.5)
WBC: 9 10*3/uL (ref 4.0–10.5)

## 2018-02-27 LAB — COMPREHENSIVE METABOLIC PANEL
ALT: 11 U/L (ref 0–44)
ANION GAP: 9 (ref 5–15)
AST: 17 U/L (ref 15–41)
Albumin: 3.5 g/dL (ref 3.5–5.0)
Alkaline Phosphatase: 49 U/L (ref 38–126)
BILIRUBIN TOTAL: 0.8 mg/dL (ref 0.3–1.2)
BUN: 9 mg/dL (ref 6–20)
CHLORIDE: 104 mmol/L (ref 98–111)
CO2: 25 mmol/L (ref 22–32)
Calcium: 9 mg/dL (ref 8.9–10.3)
Creatinine, Ser: 0.83 mg/dL (ref 0.61–1.24)
Glucose, Bld: 99 mg/dL (ref 70–99)
Potassium: 3.6 mmol/L (ref 3.5–5.1)
Sodium: 138 mmol/L (ref 135–145)
TOTAL PROTEIN: 6.6 g/dL (ref 6.5–8.1)

## 2018-02-27 LAB — HIV ANTIBODY (ROUTINE TESTING W REFLEX): HIV SCREEN 4TH GENERATION: NONREACTIVE

## 2018-02-27 MED ORDER — TECHNETIUM TC 99M MEBROFENIN IV KIT
5.3000 | PACK | Freq: Once | INTRAVENOUS | Status: AC
  Administered 2018-02-27: 5.3 via INTRAVENOUS

## 2018-02-27 MED ORDER — MORPHINE SULFATE (PF) 4 MG/ML IV SOLN: 4.7400 mg | Freq: Once | INTRAVENOUS | Status: DC

## 2018-02-27 MED ORDER — MORPHINE SULFATE (PF) 4 MG/ML IV SOLN
INTRAVENOUS | Status: AC
  Administered 2018-02-27: 4 mg via INTRAVENOUS
  Filled 2018-02-27: qty 1

## 2018-02-27 MED ORDER — MORPHINE SULFATE (PF) 4 MG/ML IV SOLN
4.0000 mg | Freq: Once | INTRAVENOUS | Status: AC
  Administered 2018-02-27: 4 mg via INTRAVENOUS

## 2018-02-27 NOTE — Progress Notes (Addendum)
[facility] Progress Note     Subjective: CC:  Denies epigastric pain this morning. Reports mild lower abdominal discomfort. Denies fever, chills, nausea, or vomiting. Again reports 10/10 stabbing epigastric pain woke him up from his sleep two nights ago - pain dissipated by yesterday evening. Prior to onset of pain patient had [employer] for dinner. Denies radiation to RUQ. Last BM was yesterday morning - small, non-bloody. Denies regular history of NSAID use. Denies tobacco or alcohol use.  Denies ever having a colonoscopy.  Objective: Vital signs in last 24 hours: Temp:  [97.9 F (36.6 C)-99.4 F (37.4 C)] 98.6 F (37 C) (09/17 0800) Pulse Rate:  [46-78] 64 (09/17 0800) Resp:  [10-19] 16 (09/17 0800) BP: (103-147)/(61-91) 120/81 (09/17 0800) SpO2:  [89 %-99 %] 98 % (09/17 0800) Last BM Date: 02/26/18  Intake/Output from previous day: 09/16 0701 - 09/17 0700 In: 2163.7 [P.O.:240; I.V.:923.7; IV Piggyback:1000] Out: 1750 [Urine:1750] Intake/Output this shift: No intake/output data recorded.  PE: Gen:  Alert, NAD, pleasant Card:  Regular rate and rhythm, pedal pulses 2+ BL Pulm:  Normal effort, clear to auscultation bilaterally Abd: Soft, obese, mild upper abdominal tenderness (epigastric pain > RUQ pain), non-distended, bowel sounds present in all 4 quadrants Skin: warm and dry, no rashes  Psych: A&Ox3   Lab Results:  Recent Labs    02/26/18 1743 02/27/18 0438  WBC 11.7* 9.0  HGB 15.3 14.2  HCT 43.7 41.1  PLT 198 169   BMET Recent Labs    02/26/18 0939 02/26/18 1743 02/27/18 0438  NA 137  --  138  K 3.9  --  3.6  CL 100  --  104  CO2 26  --  25  GLUCOSE 141*  --  99  BUN 14  --  9  CREATININE 0.93 0.76 0.83  CALCIUM 9.1  --  9.0   PT/INR No results for input(s): LABPROT, INR in the last 72 hours. CMP     Component Value Date/Time   NA 138 02/27/2018 0438   K 3.6 02/27/2018 0438   CL 104 02/27/2018 0438   CO2 25 02/27/2018 0438    GLUCOSE 99 02/27/2018 0438   BUN 9 02/27/2018 0438   CREATININE 0.83 02/27/2018 0438   CALCIUM 9.0 02/27/2018 0438   PROT 6.6 02/27/2018 0438   ALBUMIN 3.5 02/27/2018 0438   AST 17 02/27/2018 0438   ALT 11 02/27/2018 0438   ALKPHOS 49 02/27/2018 0438   BILITOT 0.8 02/27/2018 0438   GFRNONAA >60 02/27/2018 0438   GFRAA >60 02/27/2018 0438   Lipase     Component Value Date/Time   LIPASE 29 02/26/2018 0939       Studies/Results: Dg Chest 2 View  Result Date: 02/26/2018 CLINICAL DATA:  Upper abdominal pain radiating to back, epigastric pain. EXAM: CHEST - 2 VIEW COMPARISON:  None. FINDINGS: Linear density at the left lung base, likely lingular scarring or atelectasis. Right lung clear. Heart is normal size. No effusions or acute bony abnormality. IMPRESSION: Minimal left basilar scarring or atelectasis. No acute cardiopulmonary disease. Electronically Signed   By: Kevin  Dover M.D.   On: 02/26/2018 09:45   Ct Angio Chest/abd/pel For Dissection W And/or Wo Contrast  Result Date: 02/26/2018 CLINICAL DATA:  Epigastric chest/abdominal pains that started this morning radiating through to his back, patient is very clammy and appears diaphoretic, no known chest or abdominal/pelvic history, no other complaints^ EXAM: CT ANGIOGRAPHY CHEST, ABDOMEN AND PELVIS TECHNIQUE: Multidetector CT imaging through the chest,   abdomen and pelvis was performed using the standard protocol during bolus administration of intravenous contrast. Multiplanar reconstructed images and MIPs were obtained and reviewed to evaluate the vascular anatomy. CONTRAST:  100mL ISOVUE-370 IOPAMIDOL (ISOVUE-370) INJECTION 76% COMPARISON:  None available FINDINGS: CTA CHEST FINDINGS Cardiovascular: Heart size normal. No pericardial effusion. Satisfactory opacification of pulmonary arteries noted, and there is no evidence of pulmonary emboli. No hyperdense crescent involving the aorta on the noncontrast study. Adequate contrast  opacification of the thoracic aorta with no evidence of dissection, aneurysm, or stenosis. There is classic 3-vessel brachiocephalic arch anatomy without proximal stenosis. No significant atheromatous plaque. Mediastinum/Nodes: No hilar or mediastinal adenopathy. Lungs/Pleura: No pleural effusion. No pneumothorax. Minimal dependent atelectasis posteriorly in both lower lobes. Lungs otherwise clear. Musculoskeletal: No chest wall abnormality. No acute or significant osseous findings. Review of the MIP images confirms the above findings. CTA ABDOMEN AND PELVIS FINDINGS VASCULAR Aorta: Normal caliber aorta without aneurysm, dissection, vasculitis or significant stenosis. Celiac: Patent without evidence of aneurysm, dissection, vasculitis or significant stenosis. SMA: Patent without evidence of aneurysm, dissection, vasculitis or significant stenosis. Renals: Both renal arteries are patent without evidence of aneurysm, dissection, vasculitis, fibromuscular dysplasia or significant stenosis. IMA: Patent without evidence of aneurysm, dissection, vasculitis or significant stenosis. Inflow: Patent without evidence of aneurysm, dissection, vasculitis or significant stenosis. Veins: No obvious venous abnormality within the limitations of this arterial phase study. Review of the MIP images confirms the above findings. NON-VASCULAR Hepatobiliary: Unremarkable liver. At least 2 partially calcified stones measuring over 1 cm in the dependent aspect of the nondilated gallbladder. No biliary ductal dilatation. Pancreas: Unremarkable. No pancreatic ductal dilatation or surrounding inflammatory changes. Spleen: Normal in size without focal abnormality. Adrenals/Urinary Tract: Adrenal glands are unremarkable. Kidneys are normal, without renal calculi, focal lesion, or hydronephrosis. Bladder is unremarkable. Stomach/Bowel: Stomach is physiologically distended. The small bowel is nondilated. Normal appendix. Colon is nondilated,  unremarkable. Lymphatic: No abdominal or pelvic adenopathy. Reproductive: Prostatic enlargement Other: No ascites.  No free air. Musculoskeletal: Degenerative disc disease L4-5, L5-S1. No fracture or worrisome bone lesion. Review of the MIP images confirms the above findings. IMPRESSION: 1. Negative for aortic dissection or other acute vascular finding. 2. Cholelithiasis without other CT evidence of cholecystitis or biliary obstruction. 3. Lumbar spondylitic changes Electronically Signed   By: D  Hassell M.D.   On: 02/26/2018 11:01   [place] Abdomen Limited Ruq  Result Date: 02/26/2018 CLINICAL DATA:  Epigastric abdominal pain. The patient is found to have gallstones on CT scan today. EXAM: ULTRASOUND ABDOMEN LIMITED RIGHT UPPER QUADRANT COMPARISON:  CT scan of the chest of today's date FINDINGS: Gallbladder: The gallbladder is adequately distended. There is a gallstone present measuring 1.7 cm in diameter. There is also sludge. There is a positive sonographic Murphy's sign. The gallbladder wall thickness is normal at 2.5 mm. There is no pericholecystic fluid. Common bile duct: Diameter: 3.4 mm Liver: Hepatic echotexture is compatible with fatty infiltrative change. Focal fatty sparing adjacent to the gallbladder is suspected. There is no intrahepatic ductal dilation. Portal vein is patent on color Doppler imaging with normal direction of blood flow towards the liver. IMPRESSION: Gallstones and sludge. Positive sonographic Murphy's sign. The findings are consistent with cholelithiasis and acute cholecystitis. Increased hepatic echotexture most compatible with fatty infiltrative changes. These results will be called to the ordering clinician or representative by the Radiologist Assistant, and communication documented in the PACS or zVision Dashboard. Electronically Signed   By: David  Jordan M.D.   On:   02/26/2018 12:05    Anti-infectives: Anti-infectives (From admission, onward)   None      Assessment/Plan Cholelithiasis  Epigastric abdominal pain, acute  - afebrile, VSS, pain improving  - Differential Dx: biliary colic, cholecystitis, PUD, GERD - 1.7 cm gallstone noted on imaging; no signs of cholecystitis, CBC/CMET are within normal limits  - HIDA today to r/o cholecystitis  - patient has clinically improved, if HIDA is negative I recommend a PO challenge to see if sxs recur. If he remains improved, I think discharging the patient home with outpatient GI workup to r/o PUD is reasonable. Recommend colonoscopy as patient has never had this screening test performed.   FEN: NPO, IVF ID: none VTE: SCD's Follow up: TBD   LOS: 1 day    Elizabeth Simaan, PA-C [facility] Pager: [phone]        

## 2018-02-27 NOTE — H&P (View-Only) (Signed)
[facility] Progress Note     Subjective: CC:  Denies epigastric pain this morning. Reports mild lower abdominal discomfort. Denies fever, chills, nausea, or vomiting. Again reports 10/10 stabbing epigastric pain woke him up from his sleep two nights ago - pain dissipated by yesterday evening. Prior to onset of pain patient had [employer] for dinner. Denies radiation to RUQ. Last BM was yesterday morning - small, non-bloody. Denies regular history of NSAID use. Denies tobacco or alcohol use.  Denies ever having a colonoscopy.  Objective: Vital signs in last 24 hours: Temp:  [97.9 F (36.6 C)-99.4 F (37.4 C)] 98.6 F (37 C) (09/17 0800) Pulse Rate:  [46-78] 64 (09/17 0800) Resp:  [10-19] 16 (09/17 0800) BP: (103-147)/(61-91) 120/81 (09/17 0800) SpO2:  [89 %-99 %] 98 % (09/17 0800) Last BM Date: 02/26/18  Intake/Output from previous day: 09/16 0701 - 09/17 0700 In: 2163.7 [P.O.:240; I.V.:923.7; IV Piggyback:1000] Out: 1750 [Urine:1750] Intake/Output this shift: No intake/output data recorded.  PE: Gen:  Alert, NAD, pleasant Card:  Regular rate and rhythm, pedal pulses 2+ BL Pulm:  Normal effort, clear to auscultation bilaterally Abd: Soft, obese, mild upper abdominal tenderness (epigastric pain > RUQ pain), non-distended, bowel sounds present in all 4 quadrants Skin: warm and dry, no rashes  Psych: A&Ox3   Lab Results:  Recent Labs    02/26/18 1743 02/27/18 0438  WBC 11.7* 9.0  HGB 15.3 14.2  HCT 43.7 41.1  PLT 198 169   BMET Recent Labs    02/26/18 0939 02/26/18 1743 02/27/18 0438  NA 137  --  138  K 3.9  --  3.6  CL 100  --  104  CO2 26  --  25  GLUCOSE 141*  --  99  BUN 14  --  9  CREATININE 0.93 0.76 0.83  CALCIUM 9.1  --  9.0   PT/INR No results for input(s): LABPROT, INR in the last 72 hours. CMP     Component Value Date/Time   NA 138 02/27/2018 0438   K 3.6 02/27/2018 0438   CL 104 02/27/2018 0438   CO2 25 02/27/2018 0438    GLUCOSE 99 02/27/2018 0438   BUN 9 02/27/2018 0438   CREATININE 0.83 02/27/2018 0438   CALCIUM 9.0 02/27/2018 0438   PROT 6.6 02/27/2018 0438   ALBUMIN 3.5 02/27/2018 0438   AST 17 02/27/2018 0438   ALT 11 02/27/2018 0438   ALKPHOS 49 02/27/2018 0438   BILITOT 0.8 02/27/2018 0438   GFRNONAA >60 02/27/2018 0438   GFRAA >60 02/27/2018 0438   Lipase     Component Value Date/Time   LIPASE 29 02/26/2018 0939       Studies/Results: Dg Chest 2 View  Result Date: 02/26/2018 CLINICAL DATA:  Upper abdominal pain radiating to back, epigastric pain. EXAM: CHEST - 2 VIEW COMPARISON:  None. FINDINGS: Linear density at the left lung base, likely lingular scarring or atelectasis. Right lung clear. Heart is normal size. No effusions or acute bony abnormality. IMPRESSION: Minimal left basilar scarring or atelectasis. No acute cardiopulmonary disease. Electronically Signed   By: Charlett Nose M.D.   On: 02/26/2018 09:45   Ct Angio Chest/abd/pel For Dissection W And/or Wo Contrast  Result Date: 02/26/2018 CLINICAL DATA:  Epigastric chest/abdominal pains that started this morning radiating through to his back, patient is very clammy and appears diaphoretic, no known chest or abdominal/pelvic history, no other complaints^ EXAM: CT ANGIOGRAPHY CHEST, ABDOMEN AND PELVIS TECHNIQUE: Multidetector CT imaging through the chest,  abdomen and pelvis was performed using the standard protocol during bolus administration of intravenous contrast. Multiplanar reconstructed images and MIPs were obtained and reviewed to evaluate the vascular anatomy. CONTRAST:  ISOVUE-370 IOPAMIDOL (ISOVUE-370) INJECTION 76% COMPARISON:  None available FINDINGS: CTA CHEST FINDINGS Cardiovascular: Heart size normal. No pericardial effusion. Satisfactory opacification of pulmonary arteries noted, and there is no evidence of pulmonary emboli. No hyperdense crescent involving the aorta on the noncontrast study. Adequate contrast  opacification of the thoracic aorta with no evidence of dissection, aneurysm, or stenosis. There is classic 3-vessel brachiocephalic arch anatomy without proximal stenosis. No significant atheromatous plaque. Mediastinum/Nodes: No hilar or mediastinal adenopathy. Lungs/Pleura: No pleural effusion. No pneumothorax. Minimal dependent atelectasis posteriorly in both lower lobes. Lungs otherwise clear. Musculoskeletal: No chest wall abnormality. No acute or significant osseous findings. Review of the MIP images confirms the above findings. CTA ABDOMEN AND PELVIS FINDINGS VASCULAR Aorta: Normal caliber aorta without aneurysm, dissection, vasculitis or significant stenosis. Celiac: Patent without evidence of aneurysm, dissection, vasculitis or significant stenosis. SMA: Patent without evidence of aneurysm, dissection, vasculitis or significant stenosis. Renals: Both renal arteries are patent without evidence of aneurysm, dissection, vasculitis, fibromuscular dysplasia or significant stenosis. IMA: Patent without evidence of aneurysm, dissection, vasculitis or significant stenosis. Inflow: Patent without evidence of aneurysm, dissection, vasculitis or significant stenosis. Veins: No obvious venous abnormality within the limitations of this arterial phase study. Review of the MIP images confirms the above findings. NON-VASCULAR Hepatobiliary: Unremarkable liver. At least 2 partially calcified stones measuring over 1 cm in the dependent aspect of the nondilated gallbladder. No biliary ductal dilatation. Pancreas: Unremarkable. No pancreatic ductal dilatation or surrounding inflammatory changes. Spleen: Normal in size without focal abnormality. Adrenals/Urinary Tract: Adrenal glands are unremarkable. Kidneys are normal, without renal calculi, focal lesion, or hydronephrosis. Bladder is unremarkable. Stomach/Bowel: Stomach is physiologically distended. The small bowel is nondilated. Normal appendix. Colon is nondilated,  unremarkable. Lymphatic: No abdominal or pelvic adenopathy. Reproductive: Prostatic enlargement Other: No ascites.  No free air. Musculoskeletal: Degenerative disc disease L4-5, L5-S1. No fracture or worrisome bone lesion. Review of the MIP images confirms the above findings. IMPRESSION: 1. Negative for aortic dissection or other acute vascular finding. 2. Cholelithiasis without other CT evidence of cholecystitis or biliary obstruction. 3. Lumbar spondylitic changes Electronically Signed   By: Corlis Leak M.D.   On: 02/26/2018 11:01   US Abdomen Limited Ruq  Result Date: 02/26/2018 CLINICAL DATA:  Epigastric abdominal pain. The patient is found to have gallstones on CT scan today. EXAM: ULTRASOUND ABDOMEN LIMITED RIGHT UPPER QUADRANT COMPARISON:  CT scan of the chest of today's date FINDINGS: Gallbladder: The gallbladder is adequately distended. There is a gallstone present measuring 1.7 cm in diameter. There is also sludge. There is a positive sonographic Murphy's sign. The gallbladder wall thickness is normal at 2.5 mm. There is no pericholecystic fluid. Common bile duct: Diameter: 3.4 mm Liver: Hepatic echotexture is compatible with fatty infiltrative change. Focal fatty sparing adjacent to the gallbladder is suspected. There is no intrahepatic ductal dilation. Portal vein is patent on color Doppler imaging with normal direction of blood flow towards the liver. IMPRESSION: Gallstones and sludge. Positive sonographic Murphy's sign. The findings are consistent with cholelithiasis and acute cholecystitis. Increased hepatic echotexture most compatible with fatty infiltrative changes. These results will be called to the ordering clinician or representative by the Radiologist Assistant, and communication documented in the PACS or zVision Dashboard. Electronically Signed   By: David  [place] M.D.   On:  02/26/2018 12:05    Anti-infectives: Anti-infectives (From admission, onward)   None      Assessment/Plan Cholelithiasis  Epigastric abdominal pain, acute  - afebrile, VSS, pain improving  - Differential Dx: biliary colic, cholecystitis, PUD, GERD - 1.7 cm gallstone noted on imaging; no signs of cholecystitis, CBC/CMET are within normal limits  - HIDA today to r/o cholecystitis  - patient has clinically improved, if HIDA is negative I recommend a PO challenge to see if sxs recur. If he remains improved, I think discharging the patient home with outpatient GI workup to r/o PUD is reasonable. Recommend colonoscopy as patient has never had this screening test performed.   FEN: NPO, IVF ID: none VTE: SCD's Follow up: TBD   LOS: 1 day    Hosie SpangleElizabeth Myan Locatelli, [facility] Pager: [phone]

## 2018-02-28 ENCOUNTER — Inpatient Hospital Stay (HOSPITAL_COMMUNITY): Payer: Managed Care, Other (non HMO)

## 2018-02-28 ENCOUNTER — Inpatient Hospital Stay (HOSPITAL_COMMUNITY): Payer: Managed Care, Other (non HMO) | Admitting: Anesthesiology

## 2018-02-28 ENCOUNTER — Encounter (HOSPITAL_COMMUNITY): Payer: Self-pay | Admitting: *Deleted

## 2018-02-28 ENCOUNTER — Encounter (HOSPITAL_COMMUNITY): Admission: EM | Disposition: A | Discharge status: Home / Self Care | Payer: Self-pay | Source: Home / Self Care

## 2018-02-28 DIAGNOSIS — Z9049 Acquired absence of other specified parts of digestive tract: Secondary | ICD-10-CM

## 2018-02-28 HISTORY — PX: CHOLECYSTECTOMY: SHX55

## 2018-02-28 LAB — SURGICAL PCR SCREEN
MRSA, PCR: NEGATIVE
Staphylococcus aureus: POSITIVE — AB

## 2018-02-28 LAB — CBC
HEMATOCRIT: 45.7 % (ref 39.0–52.0)
Hemoglobin: 15.7 g/dL (ref 13.0–17.0)
MCH: 30.7 pg (ref 26.0–34.0)
MCHC: 34.4 g/dL (ref 30.0–36.0)
MCV: 89.4 fL (ref 78.0–100.0)
PLATELETS: 144 10*3/uL — AB (ref 150–400)
RBC: 5.11 MIL/uL (ref 4.22–5.81)
RDW: 12.5 % (ref 11.5–15.5)
WBC: 12.2 10*3/uL — AB (ref 4.0–10.5)

## 2018-02-28 LAB — CREATININE, SERUM
Creatinine, Ser: 0.98 mg/dL (ref 0.61–1.24)
GFR calc non Af Amer: >60 mL/min (ref >60)

## 2018-02-28 SURGERY — LAPAROSCOPIC CHOLECYSTECTOMY WITH INTRAOPERATIVE CHOLANGIOGRAM
Anesthesia: General

## 2018-02-28 MED ORDER — LIDOCAINE 2% (20 MG/ML) 5 ML SYRINGE
INTRAMUSCULAR | Status: AC
  Filled 2018-02-28: qty 5

## 2018-02-28 MED ORDER — EPHEDRINE SULFATE-NACL 50-0.9 MG/10ML-% IV SOSY
PREFILLED_SYRINGE | INTRAVENOUS | Status: DC | PRN
  Administered 2018-02-28: 10 mg via INTRAVENOUS

## 2018-02-28 MED ORDER — ONDANSETRON 4 MG PO TBDP: 4.0000 mg | ORAL_TABLET | Freq: Four times a day (QID) | ORAL | Status: DC | PRN

## 2018-02-28 MED ORDER — LACTATED RINGERS IV SOLN
INTRAVENOUS | Status: DC
  Administered 2018-02-28: 12:00:00 via INTRAVENOUS

## 2018-02-28 MED ORDER — FENTANYL CITRATE (PF) 100 MCG/2ML IJ SOLN
INTRAMUSCULAR | Status: AC
  Filled 2018-02-28: qty 2

## 2018-02-28 MED ORDER — ROCURONIUM BROMIDE 50 MG/5ML IV SOSY
PREFILLED_SYRINGE | INTRAVENOUS | Status: DC | PRN
  Administered 2018-02-28: 60 mg via INTRAVENOUS
  Administered 2018-02-28: 20 mg via INTRAVENOUS

## 2018-02-28 MED ORDER — BUPIVACAINE LIPOSOME 1.3 % IJ SUSP
20.0000 mL | Freq: Once | INTRAMUSCULAR | Status: AC
  Administered 2018-02-28: 20 mL
  Filled 2018-02-28: qty 20

## 2018-02-28 MED ORDER — HYDROCODONE-ACETAMINOPHEN 5-325 MG PO TABS
1.0000 | ORAL_TABLET | ORAL | Status: DC | PRN
  Filled 2018-02-28: qty 1

## 2018-02-28 MED ORDER — TRAMADOL HCL 50 MG PO TABS: 50.0000 mg | ORAL_TABLET | Freq: Four times a day (QID) | ORAL | Status: DC | PRN

## 2018-02-28 MED ORDER — DEXAMETHASONE SODIUM PHOSPHATE 10 MG/ML IJ SOLN
INTRAMUSCULAR | Status: DC | PRN
  Administered 2018-02-28: 10 mg via INTRAVENOUS

## 2018-02-28 MED ORDER — FAMOTIDINE IN NACL 20-0.9 MG/50ML-% IV SOLN
20.0000 mg | Freq: Two times a day (BID) | INTRAVENOUS | Status: DC
  Administered 2018-02-28 – 2018-03-01 (×2): 20 mg via INTRAVENOUS
  Filled 2018-02-28 (×3): qty 50

## 2018-02-28 MED ORDER — FENTANYL CITRATE (PF) 100 MCG/2ML IJ SOLN
INTRAMUSCULAR | Status: DC | PRN
  Administered 2018-02-28 (×3): 50 ug via INTRAVENOUS

## 2018-02-28 MED ORDER — ONDANSETRON HCL 4 MG/2ML IJ SOLN
INTRAMUSCULAR | Status: AC
  Filled 2018-02-28: qty 2

## 2018-02-28 MED ORDER — PHENYLEPHRINE 40 MCG/ML (10ML) SYRINGE FOR IV PUSH (FOR BLOOD PRESSURE SUPPORT)
PREFILLED_SYRINGE | INTRAVENOUS | Status: DC | PRN
  Administered 2018-02-28: 80 ug via INTRAVENOUS

## 2018-02-28 MED ORDER — RINGERS IRRIGATION IR SOLN
Status: DC | PRN
  Administered 2018-02-28: 1000 mL

## 2018-02-28 MED ORDER — IOPAMIDOL (ISOVUE-300) INJECTION 61%
INTRAVENOUS | Status: AC
  Filled 2018-02-28: qty 50

## 2018-02-28 MED ORDER — KCL IN DEXTROSE-NACL 20-5-0.45 MEQ/L-%-% IV SOLN
INTRAVENOUS | Status: DC
  Administered 2018-02-28 – 2018-03-01 (×2): via INTRAVENOUS
  Filled 2018-02-28 (×2): qty 1000

## 2018-02-28 MED ORDER — FENTANYL CITRATE (PF) 250 MCG/5ML IJ SOLN
INTRAMUSCULAR | Status: AC
  Filled 2018-02-28: qty 5

## 2018-02-28 MED ORDER — EPHEDRINE 5 MG/ML INJ
INTRAVENOUS | Status: AC
  Filled 2018-02-28: qty 10

## 2018-02-28 MED ORDER — HEPARIN SODIUM (PORCINE) 5000 UNIT/ML IJ SOLN
5000.0000 [IU] | Freq: Three times a day (TID) | INTRAMUSCULAR | Status: DC
  Administered 2018-02-28 – 2018-03-01 (×2): 5000 [IU] via SUBCUTANEOUS
  Filled 2018-02-28 (×2): qty 1

## 2018-02-28 MED ORDER — MIDAZOLAM HCL 2 MG/2ML IJ SOLN
INTRAMUSCULAR | Status: AC
  Filled 2018-02-28: qty 2

## 2018-02-28 MED ORDER — DEXAMETHASONE SODIUM PHOSPHATE 10 MG/ML IJ SOLN
INTRAMUSCULAR | Status: AC
  Filled 2018-02-28: qty 1

## 2018-02-28 MED ORDER — ONDANSETRON HCL 4 MG/2ML IJ SOLN: 4.0000 mg | Freq: Four times a day (QID) | INTRAMUSCULAR | Status: DC | PRN

## 2018-02-28 MED ORDER — LIDOCAINE 2% (20 MG/ML) 5 ML SYRINGE
INTRAMUSCULAR | Status: DC | PRN
  Administered 2018-02-28: 100 mg via INTRAVENOUS

## 2018-02-28 MED ORDER — HYDRALAZINE HCL 20 MG/ML IJ SOLN: 10.0000 mg | INTRAMUSCULAR | Status: DC | PRN

## 2018-02-28 MED ORDER — ONDANSETRON HCL 4 MG/2ML IJ SOLN
INTRAMUSCULAR | Status: DC | PRN
  Administered 2018-02-28: 4 mg via INTRAVENOUS

## 2018-02-28 MED ORDER — PROPOFOL 10 MG/ML IV BOLUS
INTRAVENOUS | Status: DC | PRN
  Administered 2018-02-28: 200 mg via INTRAVENOUS

## 2018-02-28 MED ORDER — SUGAMMADEX SODIUM 500 MG/5ML IV SOLN
INTRAVENOUS | Status: AC
  Filled 2018-02-28: qty 5

## 2018-02-28 MED ORDER — SODIUM CHLORIDE 0.9 % IV SOLN
2.0000 g | INTRAVENOUS | Status: AC
  Administered 2018-02-28: 2 g via INTRAVENOUS
  Filled 2018-02-28 (×2): qty 20

## 2018-02-28 MED ORDER — ROCURONIUM BROMIDE 10 MG/ML (PF) SYRINGE
PREFILLED_SYRINGE | INTRAVENOUS | Status: AC
  Filled 2018-02-28: qty 10

## 2018-02-28 MED ORDER — SODIUM CHLORIDE 0.9 % IV SOLN
2.0000 g | INTRAVENOUS | Status: DC
  Administered 2018-03-01: 2 g via INTRAVENOUS
  Filled 2018-02-28: qty 20

## 2018-02-28 MED ORDER — SUCCINYLCHOLINE CHLORIDE 200 MG/10ML IV SOSY
PREFILLED_SYRINGE | INTRAVENOUS | Status: DC | PRN
  Administered 2018-02-28: 120 mg via INTRAVENOUS

## 2018-02-28 MED ORDER — MORPHINE SULFATE (PF) 2 MG/ML IV SOLN: 1.0000 mg | INTRAVENOUS | Status: DC | PRN

## 2018-02-28 MED ORDER — PROPOFOL 10 MG/ML IV BOLUS
INTRAVENOUS | Status: AC
  Filled 2018-02-28: qty 20

## 2018-02-28 MED ORDER — GABAPENTIN 300 MG PO CAPS
300.0000 mg | ORAL_CAPSULE | Freq: Two times a day (BID) | ORAL | Status: DC
  Administered 2018-02-28 – 2018-03-01 (×2): 300 mg via ORAL
  Filled 2018-02-28 (×2): qty 1

## 2018-02-28 MED ORDER — 0.9 % SODIUM CHLORIDE (POUR BTL) OPTIME
TOPICAL | Status: DC | PRN
  Administered 2018-02-28: 1000 mL

## 2018-02-28 MED ORDER — IOPAMIDOL (ISOVUE-300) INJECTION 61%
INTRAVENOUS | Status: DC | PRN
  Administered 2018-02-28: 8 mL

## 2018-02-28 MED ORDER — MIDAZOLAM HCL 5 MG/5ML IJ SOLN
INTRAMUSCULAR | Status: DC | PRN
  Administered 2018-02-28: 2 mg via INTRAVENOUS

## 2018-02-28 MED ORDER — SUGAMMADEX SODIUM 500 MG/5ML IV SOLN
INTRAVENOUS | Status: DC | PRN
  Administered 2018-02-28: 250 mg via INTRAVENOUS

## 2018-02-28 SURGICAL SUPPLY — 38 items
APPLICATOR COTTON TIP 6 STRL (MISCELLANEOUS) ×2 IMPLANT
APPLICATOR COTTON TIP 6IN STRL (MISCELLANEOUS) ×3 IMPLANT
APPLIER CLIP ROT 10 11.4 M/L (STAPLE) ×3
BENZOIN TINCTURE PRP APPL 2/3 (GAUZE/BANDAGES/DRESSINGS) IMPLANT
CABLE HIGH FREQUENCY MONO STRZ (ELECTRODE) ×3 IMPLANT
CATH REDDICK CHOLANGI 4FR 50CM (CATHETERS) ×3 IMPLANT
CLIP APPLIE ROT 10 11.4 M/L (STAPLE) ×1 IMPLANT
CLOSURE WOUND 1/2 X4 (GAUZE/BANDAGES/DRESSINGS)
COVER MAYO STAND STRL (DRAPES) ×3 IMPLANT
COVER SURGICAL LIGHT HANDLE (MISCELLANEOUS) ×3 IMPLANT
DECANTER SPIKE VIAL GLASS SM (MISCELLANEOUS) ×3 IMPLANT
DERMABOND ADVANCED (GAUZE/BANDAGES/DRESSINGS) ×2
DERMABOND ADVANCED .7 DNX12 (GAUZE/BANDAGES/DRESSINGS) ×1 IMPLANT
DRAPE C-ARM 42X120 X-RAY (DRAPES) ×3 IMPLANT
ELECT PENCIL ROCKER SW 15FT (MISCELLANEOUS) ×2 IMPLANT
ELECT REM PT RETURN 15FT ADLT (MISCELLANEOUS) ×3 IMPLANT
GLOVE BIOGEL M 8.0 STRL (GLOVE) ×3 IMPLANT
GOWN STRL REUS W/TWL XL LVL3 (GOWN DISPOSABLE) ×9 IMPLANT
GRASPER SUT TROCAR 14GX15 (MISCELLANEOUS) ×2 IMPLANT
HEMOSTAT SURGICEL 4X8 (HEMOSTASIS) IMPLANT
IV CATH 14GX2 1/4 (CATHETERS) ×3 IMPLANT
KIT BASIN OR (CUSTOM PROCEDURE TRAY) ×3 IMPLANT
L-HOOK LAP DISP 36CM (ELECTROSURGICAL)
LHOOK LAP DISP 36CM (ELECTROSURGICAL) IMPLANT
POUCH RETRIEVAL ECOSAC 10 (ENDOMECHANICALS) IMPLANT
POUCH RETRIEVAL ECOSAC 10MM (ENDOMECHANICALS) ×2
SCISSORS LAP 5X45 EPIX DISP (ENDOMECHANICALS) ×3 IMPLANT
SET IRRIG TUBING LAPAROSCOPIC (IRRIGATION / IRRIGATOR) ×3 IMPLANT
SLEEVE XCEL OPT CAN 5 100 (ENDOMECHANICALS) ×3 IMPLANT
STRIP CLOSURE SKIN 1/2X4 (GAUZE/BANDAGES/DRESSINGS) IMPLANT
SUT MNCRL AB 4-0 PS2 18 (SUTURE) ×3 IMPLANT
SYR 20CC LL (SYRINGE) ×3 IMPLANT
TOWEL OR 17X26 10 PK STRL BLUE (TOWEL DISPOSABLE) ×3 IMPLANT
TRAY LAPAROSCOPIC (CUSTOM PROCEDURE TRAY) ×3 IMPLANT
TROCAR BLADELESS OPT 5 100 (ENDOMECHANICALS) ×3 IMPLANT
TROCAR XCEL BLUNT TIP 100MML (ENDOMECHANICALS) IMPLANT
TROCAR XCEL NON-BLD 11X100MML (ENDOMECHANICALS) ×3 IMPLANT
TUBING INSUF HEATED (TUBING) ×3 IMPLANT

## 2018-02-28 NOTE — Op Note (Signed)
Francee NodalLaurence Buccheri  Primary Care Physician:  Patient, No Pcp Per    02/28/2018  2:45 PM  Procedure: Laparoscopic Cholecystectomy with intraoperative cholangiogram  Surgeon: Susy FrizzleMatt B. Daphine DeutscherMartin, MD, FACS Asst:  None  Anes:  General  Drains:  None  Findings: Acute cholecystitis, intrahepatic small ducts with free flow into the duodenum  Description of Procedure: The patient was taken to OR 1 and given general anesthesia.  The patient was prepped with PCMX and draped sterilely. A time out was performed.  Access to the abdomen was achieved with 5 mm Optiview through the right upper quadrant.  Port placement included 3 of the 5 mm trochars and a 1112 in the upper midline.    The gallbladder was visualized and the fundus was grasped and the gallbladder was elevated. Traction on the infundibulum allowed for successful demonstration of the critical view. Inflammatory changes were striking in the infundibulum and cystic duct region.  The cystic artery was noted to come over on top of the cystic duct..  The cystic duct was identified and clipped up on the gallbladder and an incision was made in the cystic duct and the Reddick catheter was inserted after milking the cystic duct of any debris. A dynamic cholangiogram was performed which demonstrated small intrahepatic ducts the common duct and and emptying into the duodenum.    The cystic duct was then triple clipped and divided, the cystic artery was double clipped and divided and then the gallbladder was removed from the gallbladder bed. Removal of the gallbladder from the gallbladder bed was performed with electrocautery without entering it..  The gallbladder was then placed in a bag and brought out through one of the trocar sites. The gallbladder bed was inspected and no bleeding or bile leaks were seen.   Laparoscopic visualization was used when closing fascial defects for trocar sites.   Incisions were injected with Exparel and closed with 4-0 Monocryl and  Dermabond on the skin.  Sponge and needle count were correct.    The patient was taken to the recovery room in satisfactory condition.

## 2018-02-28 NOTE — Transfer of Care (Signed)
Immediate Anesthesia Transfer of Care Note  Patient: Mark Dominguez  Procedure(s) Performed: LAPAROSCOPIC CHOLECYSTECTOMY WITH INTRAOPERATIVE CHOLANGIOGRAM (N/A )  Patient Location: PACU  Anesthesia Type:General  Level of Consciousness: awake, alert , oriented and patient cooperative  Airway & Oxygen Therapy: Patient Spontanous Breathing and Patient connected to face mask oxygen  Post-op Assessment: Report given to RN and Post -op Vital signs reviewed and stable  Post vital signs: Reviewed and stable  Last Vitals:  Vitals Value Taken Time  BP 129/71 02/28/2018  2:45 PM  Temp 36.7 C 02/28/2018  2:43 PM  Pulse 87 02/28/2018  2:48 PM  Resp 18 02/28/2018  2:48 PM  SpO2 97 % 02/28/2018  2:48 PM  Vitals shown include unvalidated device data.  Last Pain:  Vitals:   02/28/18 1443  TempSrc:   PainSc: Asleep      Patients Stated Pain Goal: 2 (02/26/18 2347)  Complications: No apparent anesthesia complications

## 2018-02-28 NOTE — Interval H&P Note (Signed)
History and Physical Interval Note:  02/28/2018 12:27 PM  Mark NodalLaurence Riding  has presented today for surgery, with the diagnosis of CHOLECYSTITIS  The various methods of treatment have been discussed with the patient and family. After consideration of risks, benefits and other options for treatment, the patient has consented to  Procedure(s): LAPAROSCOPIC CHOLECYSTECTOMY WITH INTRAOPERATIVE CHOLANGIOGRAM (N/A) as a surgical intervention .  The patient's history has been reviewed, patient examined, no change in status, stable for surgery.  I have reviewed the patient's chart and labs.  Questions were answered to the patient's satisfaction.     Valarie MerinoMatthew B Levon Penning

## 2018-02-28 NOTE — Discharge Instructions (Signed)

## 2018-02-28 NOTE — Anesthesia Postprocedure Evaluation (Signed)
Anesthesia Post Note  Patient: Mark Dominguez  Procedure(s) Performed: LAPAROSCOPIC CHOLECYSTECTOMY WITH INTRAOPERATIVE CHOLANGIOGRAM (N/A )     Patient location during evaluation: PACU Anesthesia Type: General Level of consciousness: awake Pain management: pain level controlled Vital Signs Assessment: post-procedure vital signs reviewed and stable Respiratory status: spontaneous breathing Cardiovascular status: stable Postop Assessment: no apparent nausea or vomiting Anesthetic complications: no    Last Vitals:  Vitals:   02/28/18 1515 02/28/18 1531  BP: 129/73 130/72  Pulse: 68 62  Resp: 12 14  Temp: 37.1 C 36.8 C  SpO2: 95% 96%    Last Pain:  Vitals:   02/28/18 1531  TempSrc: Oral  PainSc:                  Mark Dominguez

## 2018-02-28 NOTE — Anesthesia Procedure Notes (Addendum)
Procedure Name: Intubation Date/Time: 02/28/2018 12:57 PM Performed by: Wynonia SoursWalker, Elisabeth Strom L, CRNA Pre-anesthesia Checklist: Patient identified, Emergency Drugs available, Suction available, Patient being monitored and Timeout performed Patient Re-evaluated:Patient Re-evaluated prior to induction Oxygen Delivery Method: Circle system utilized Preoxygenation: Pre-oxygenation with 100% oxygen Induction Type: IV induction Ventilation: Two handed mask ventilation required and Oral airway inserted - appropriate to patient size Laryngoscope Size: 3 and Glidescope Grade View: Grade I Tube type: Oral Tube size: 7.5 mm Number of attempts: 3 Airway Equipment and Method: Stylet and Video-laryngoscopy Placement Confirmation: ETT inserted through vocal cords under direct vision,  positive ETCO2,  CO2 detector and breath sounds checked- equal and bilateral Secured at: 23 cm Tube secured with: Tape Dental Injury: Teeth and Oropharynx as per pre-operative assessment  Comments: Attempt x 2 with direct laryngoscopy by CRNA without success Grade 3 view. Bag masked ventilation achieved with oral airway in place and two hands. Glidescope utilized with grade 1 view and successful placement of ETT by Dr Clemens CatholicWitman.

## 2018-03-01 ENCOUNTER — Encounter (HOSPITAL_COMMUNITY): Payer: Self-pay | Admitting: Surgery

## 2018-03-01 LAB — COMPREHENSIVE METABOLIC PANEL
ALBUMIN: 3.5 g/dL (ref 3.5–5.0)
ALK PHOS: 48 U/L (ref 38–126)
ALT: 29 U/L (ref 0–44)
ANION GAP: 11 (ref 5–15)
AST: 51 U/L — ABNORMAL HIGH (ref 15–41)
BUN: 10 mg/dL (ref 6–20)
CALCIUM: 8.7 mg/dL — AB (ref 8.9–10.3)
CHLORIDE: 103 mmol/L (ref 98–111)
CO2: 23 mmol/L (ref 22–32)
Creatinine, Ser: 0.81 mg/dL (ref 0.61–1.24)
GFR calc Af Amer: >60 mL/min (ref >60)
GFR calc non Af Amer: >60 mL/min (ref >60)
GLUCOSE: 143 mg/dL — AB (ref 70–99)
Potassium: 4.1 mmol/L (ref 3.5–5.1)
Sodium: 137 mmol/L (ref 135–145)
Total Bilirubin: 0.7 mg/dL (ref 0.3–1.2)
Total Protein: 6.9 g/dL (ref 6.5–8.1)

## 2018-03-01 LAB — CBC
HCT: 42.3 % (ref 39.0–52.0)
HEMOGLOBIN: 14.7 g/dL (ref 13.0–17.0)
MCH: 30.3 pg (ref 26.0–34.0)
MCHC: 34.8 g/dL (ref 30.0–36.0)
MCV: 87.2 fL (ref 78.0–100.0)
Platelets: 217 10*3/uL (ref 150–400)
RBC: 4.85 MIL/uL (ref 4.22–5.81)
RDW: 12.4 % (ref 11.5–15.5)
WBC: 10.2 10*3/uL (ref 4.0–10.5)

## 2018-03-01 MED ORDER — HYDROCODONE-ACETAMINOPHEN 5-325 MG PO TABS: 1.0000 | ORAL_TABLET | ORAL | 0 refills | Status: AC | PRN

## 2018-03-01 MED ORDER — MUPIROCIN 2 % EX OINT
1.0000 "application " | TOPICAL_OINTMENT | Freq: Two times a day (BID) | CUTANEOUS | Status: DC
  Administered 2018-03-01: 1 via NASAL
  Filled 2018-03-01: qty 22

## 2018-03-01 MED ORDER — IBUPROFEN 800 MG PO TABS: 800.0000 mg | ORAL_TABLET | Freq: Three times a day (TID) | ORAL | 0 refills | Status: AC | PRN

## 2018-03-01 NOTE — Progress Notes (Signed)
Patient discharged to home w/ wife. Given all belongings, instructions, prescriptions. Both verbalized understanding of instructions. Patient declined w/c. Escorted to pov via ambulation.

## 2018-03-01 NOTE — Discharge Summary (Signed)
[facility] Discharge Summary   Patient ID: Mark NodalLaurence Carol MRN: [id number] DOB/AGE: [birth year] 52 y.o.  Admit date: 02/26/2018 Discharge date: 03/01/2018  Discharge Diagnosis Patient Active Problem List   Diagnosis Date Noted  . S/P laparoscopic cholecystectomy Sept 2019 02/28/2018  . Abdominal pain 02/26/2018   Imaging: Dg Cholangiogram Operative  Result Date: 02/28/2018 CLINICAL DATA:  Intraoperative cholangiogram during laparoscopic cholecystectomy. EXAM: INTRAOPERATIVE CHOLANGIOGRAM FLUOROSCOPY TIME:  1 minutes, 11 seconds (48 mGy) COMPARISON:  Nuclear medicine HIDA scan - 02/27/2018; right upper quadrant abdominal ultrasound-02/26/2018 FINDINGS: Intraoperative cholangiographic images of the right upper abdominal quadrant during laparoscopic cholecystectomy are provided for review. Surgical clips overlie the expected location of the gallbladder fossa. Contrast injection demonstrates initial opacification of the anterior division of the intrahepatic right biliary tree with lack of definitive opacification of the conventional cystic duct. There is eventual passage of contrast through the cystic duct to the level of the duodenum. There are no discrete filling defects within the opacified portions of the biliary system to suggest the presence of choledocholithiasis. IMPRESSION: 1. 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 Correlation with the operative report is advised. 2. No definite evidence of choledocholithiasis. Electronically Signed   By: Simonne ComeJohn  Watts M.D.   On: 02/28/2018 14:27   Nm Hepatobiliary Liver Func  Result Date: 02/27/2018 CLINICAL DATA:  Abdominal pain.  Known cholelithiasis EXAM: NUCLEAR MEDICINE HEPATOBILIARY IMAGING TECHNIQUE: Sequential images of the abdomen were obtained out to 60 minutes following intravenous  administration of radiopharmaceutical. 4.7 mg of morphine sulfate was administered intravenously after 1 hour with imaging obtained for an additional 30 minutes time span. RADIOPHARMACEUTICALS:  5.3  mCi Tc-3556m  Choletec IV COMPARISON:  Ultrasound right upper quadrant February 26, 2018 FINDINGS: Liver uptake of radiotracer is unremarkable. There is prompt visualization of small bowel, indicating patency of the common bile duct. After 60 minutes, there was no visualization of the gallbladder. After the intravenous morphine sulfate administration, gallbladder still did not visualize. IMPRESSION: Nonvisualization of the gallbladder despite administration of intravenous morphine sulfate. This finding is felt to be indicative of cystic duct obstruction. Cystic duct obstruction is presumptive evidence of acute cholecystitis. Common bile duct is patent as is evidenced by prompt visualization of small bowel. Electronically Signed   By: Bretta BangWilliam  Woodruff III M.D.   On: 02/27/2018 16:20    Procedures Dr. Luretha MurphyMATTHEW MARTIN (02/28/18)- [facility]  [facility] Course:  52 y/o male [facility] epig[facility] pain. Workup revealed acute cholecystitis. Patient was admitted and underwent procedure listed above.  Tolerated procedure well and was transferred to the floor.  Diet was advanced as tolerated.  On POD#1, the patient was voiding well, tolerating diet, ambulating well, pain well controlled, vital signs stable, incisions c/d/i and felt stable for discharge home.  Patient will follow up in our office in 2 weeks and knows to call with questions or concerns.    Physical Exam: General:  Alert, NAD, pleasant, comfortable Abd:  Soft, ND, mild tenderness, incisions C/D/I  Allergies as of 03/01/2018   No Known Allergies     Medication List    STOP taking these medications   aspirin-acetaminophen-caffeine 250-250-65 MG tablet Commonly known as:  EXCEDRIN MIGRAINE      TAKE these medications   calcium carbonate 1250 (500 Ca) MG chewable tablet Commonly known as:  OS-CAL Chew 6 tablets by mouth daily as  needed for heartburn.   HYDROcodone-acetaminophen 5-325 MG tablet Commonly known as:  NORCO/VICODIN Take 1 tablet by mouth every 4 (four) hours as needed for moderate pain. Notes to patient:  This medication has Tylenol (acetaminophen) in it. Do not exceed 4000mg  of tylenol in a 24 hour period.    ibuprofen 800 MG tablet Commonly known as:  ADVIL,MOTRIN Take 1 tablet (800 mg total) by mouth every 8 (eight) hours as needed.      Follow-up Information    [facility] Follow up.   Why:  Please call the number on the back of your insurance card to find an in-network primary doctor to establish care with.       [facility], [place]. Go on 03/13/2018.   Specialty:  General Surgery Why:  Your appointment is 10/01 at 11:30 am Please arrive 30 minutes prior to your appointment to check in and fill out paperwork. Bring photo ID and insurance information. Contact information: [address] [place] [address] [phone]          Signed: Hosie Spangle, [facility] 03/01/2018, 12:19 PM

## 2018-03-02 NOTE — Anesthesia Postprocedure Evaluation (Signed)
Anesthesia Post Note  Patient: Mark NodalLaurence Dominguez  Procedure(s) Performed: LAPAROSCOPIC CHOLECYSTECTOMY WITH INTRAOPERATIVE CHOLANGIOGRAM (N/A )     Patient location during evaluation: PACU Anesthesia Type: General Level of consciousness: awake Pain management: pain level controlled Vital Signs Assessment: post-procedure vital signs reviewed and stable Respiratory status: spontaneous breathing Cardiovascular status: stable Postop Assessment: no apparent nausea or vomiting Anesthetic complications: no    Last Vitals:  Vitals:   03/01/18 0619 03/01/18 0930  BP: 107/62 111/67  Pulse: (!) 58 67  Resp: 20 18  Temp: 36.4 C 36.6 C  SpO2: 95% 96%    Last Pain:  Vitals:   03/01/18 0930  TempSrc: Oral  PainSc: 1                  Trenell Concannon

## 2018-03-02 NOTE — Anesthesia Preprocedure Evaluation (Addendum)
Anesthesia Evaluation  Patient identified by MRN, date of birth, ID band Patient awake    Reviewed: Allergy & Precautions, NPO status , Patient's Chart, lab work & pertinent test results  Airway Mallampati: II  TM Distance: >3 FB     Dental   Pulmonary neg pulmonary ROS,    breath sounds clear to auscultation       Cardiovascular negative cardio ROS   Rhythm:Regular Rate:Normal     Neuro/Psych    GI/Hepatic Neg liver ROS, History noted. CG   Endo/Other  negative endocrine ROS  Renal/GU negative Renal ROS     Musculoskeletal   Abdominal   Peds  Hematology   Anesthesia Other Findings   Reproductive/Obstetrics                            Anesthesia Physical Anesthesia Plan  ASA: III  Anesthesia Plan: General   Post-op Pain Management:    Induction: Intravenous  PONV Risk Score and Plan: Treatment may vary due to age or medical condition  Airway Management Planned: Oral ETT  Additional Equipment:   Intra-op Plan:   Post-operative Plan:   Informed Consent: I have reviewed the patients History and Physical, chart, labs and discussed the procedure including the risks, benefits and alternatives for the proposed anesthesia with the patient or authorized representative who has indicated his/her understanding and acceptance.   Dental advisory given  Plan Discussed with: CRNA and Anesthesiologist  Anesthesia Plan Comments:         Anesthesia Quick Evaluation

## 2019-12-30 IMAGING — CT CT ANGIO CHEST-ABD-PELV FOR DISSECTION W/ AND WO/W CM
2 of 9 series · 13 of 46 positions shown, 15 images · IV contrast (iopamidol)
Comparison: None available

CLINICAL DATA: Epigastric chest/abdominal pains that started this
morning radiating through to his back, patient is very clammy and
appears diaphoretic, no known chest or abdominal/pelvic history, no
other complaints

EXAM:
CT ANGIOGRAPHY CHEST, ABDOMEN AND PELVIS
TECHNIQUE: Multidetector CT imaging through the chest, abdomen and pelvis was
performed using the standard protocol during bolus administration of
intravenous contrast. Multiplanar reconstructed images and MIPs were
obtained and reviewed to evaluate the vascular anatomy.
CONTRAST:  100mL [id number] IOPAMIDOL ([id number]) INJECTION 76%

[Series 5: axial arterial · axial · arterial · 0.87mm/px · z∈[-747,-99]mm · 10 of 242 slices shown, 12 images]
[im 13/242  soft-tissue]
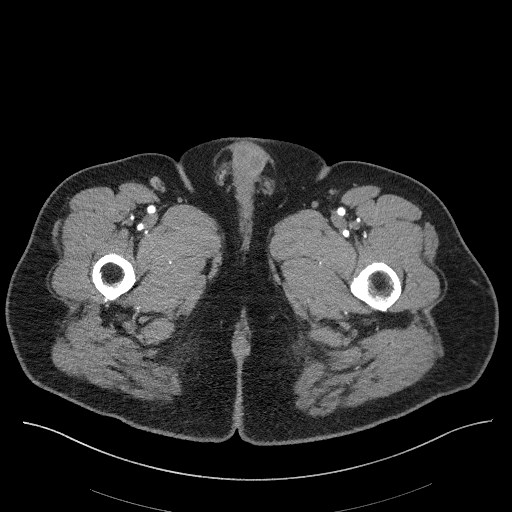
[im 13/242  bone]
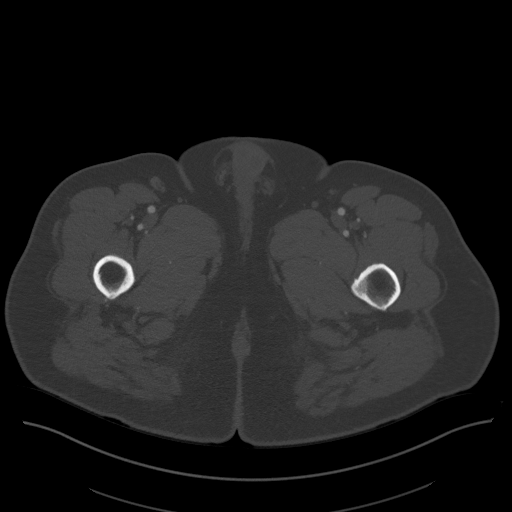
[im 39/242  soft-tissue]
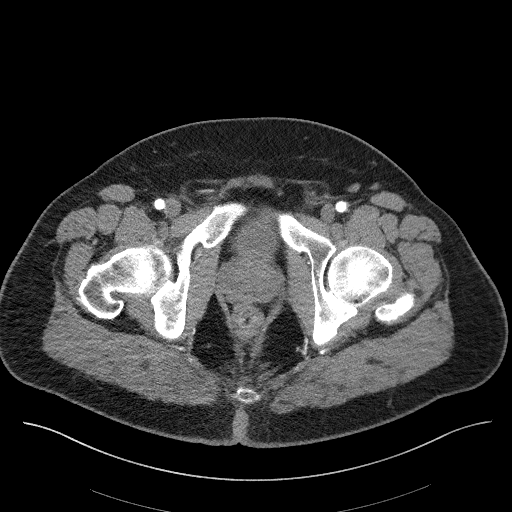
[im 64/242  soft-tissue]
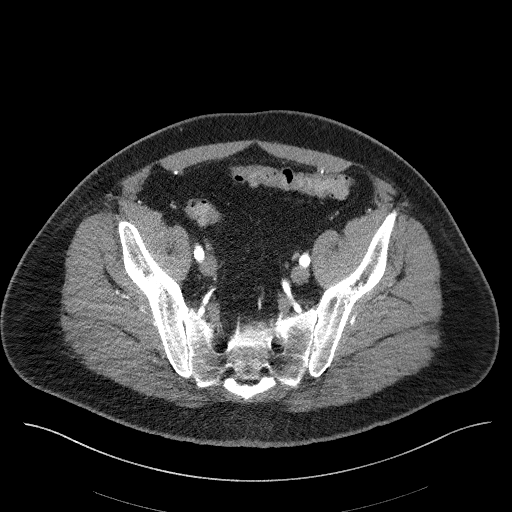
[im 89/242  soft-tissue]
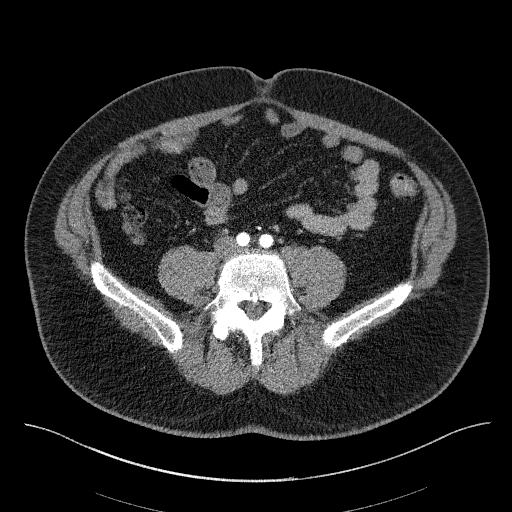
[im 115/242  soft-tissue]
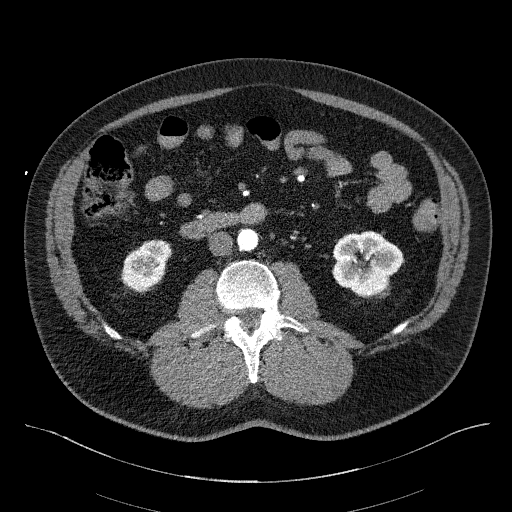
[im 127/242  soft-tissue]
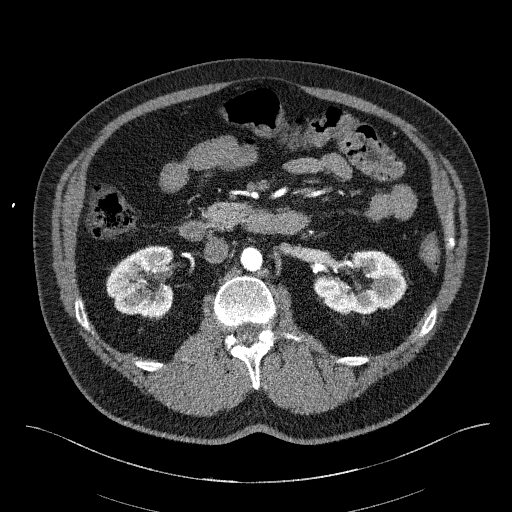
[im 153/242  soft-tissue]
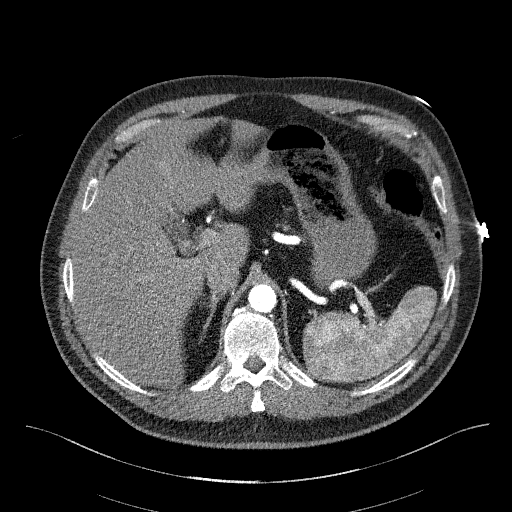
[im 178/242  soft-tissue]
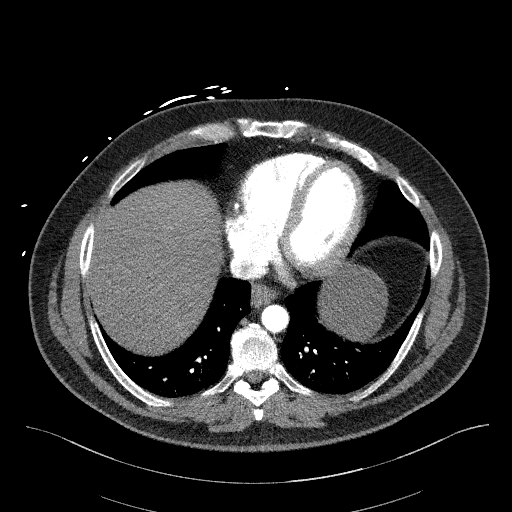
[im 203/242  soft-tissue]
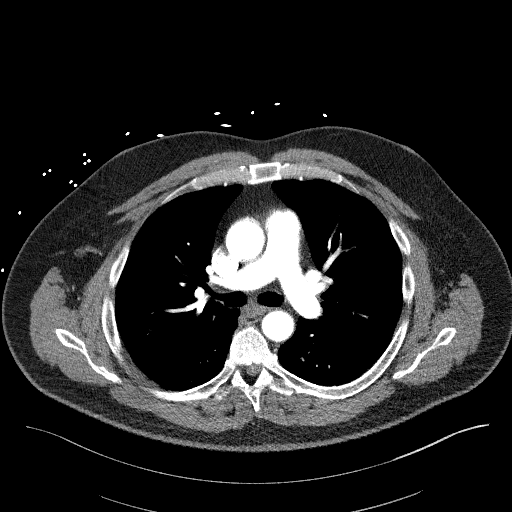
[im 203/242  bone]
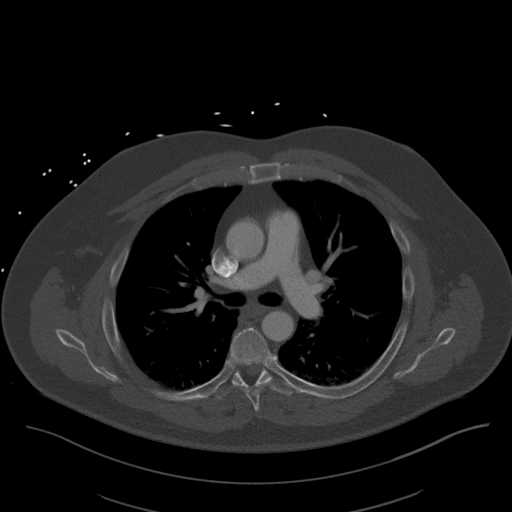
[im 229/242  soft-tissue]
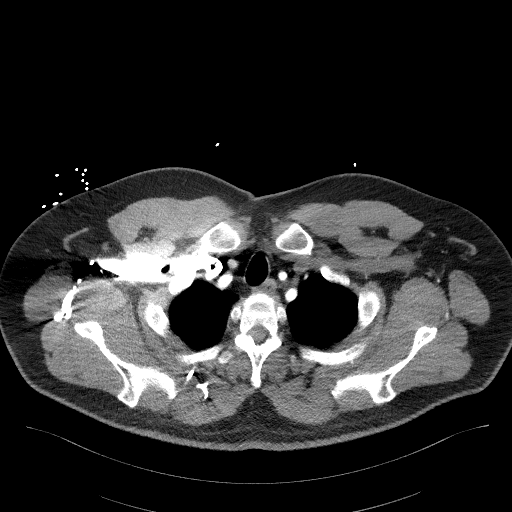

[Series 7: coronals · coronal · 0.81mm/px · 3 of 173 slices shown]
[im 44/173  soft-tissue]
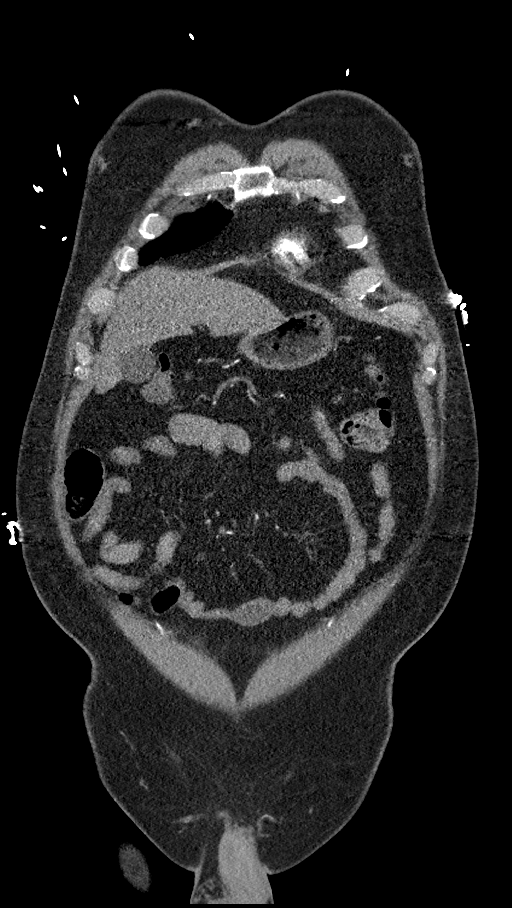
[im 87/173  soft-tissue]
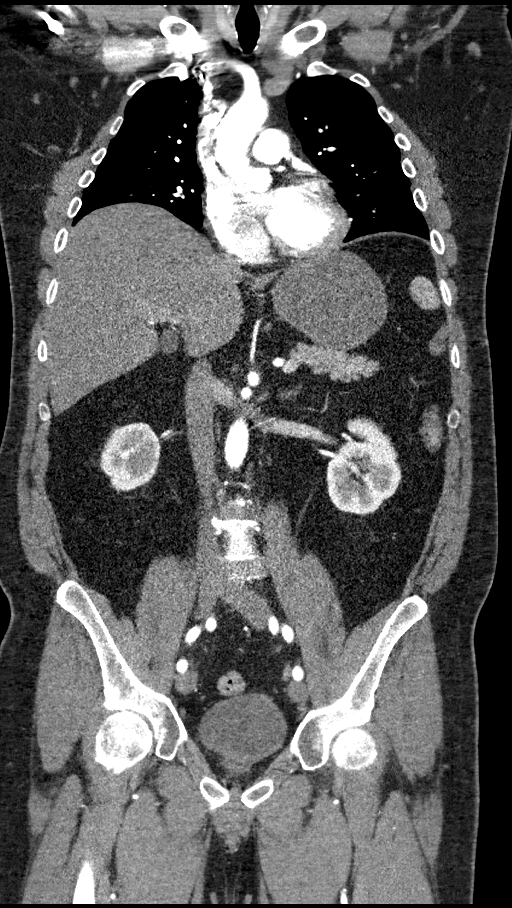
[im 130/173  soft-tissue]
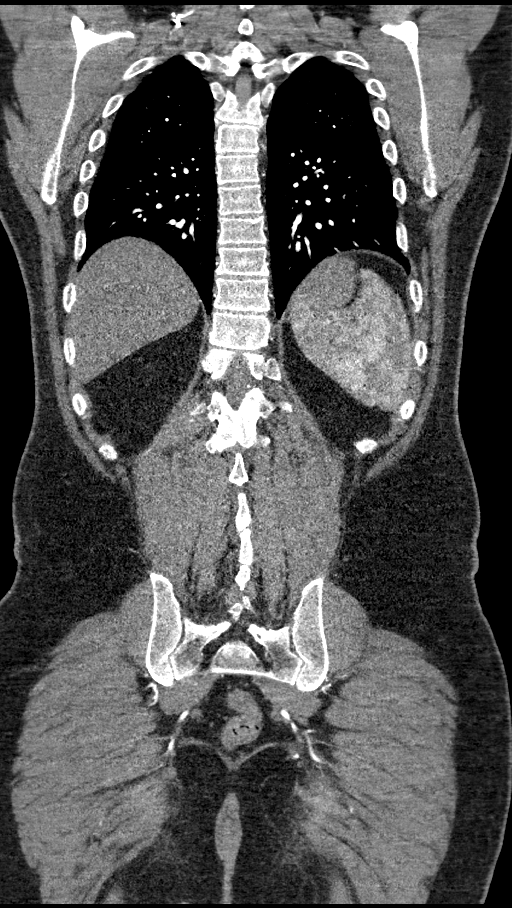

[13 of 46 positions shown; findings below may reference images not displayed]

FINDINGS: CTA CHEST FINDINGS

Cardiovascular: Heart size normal. No pericardial effusion.
Satisfactory opacification of pulmonary arteries noted, and there is
no evidence of pulmonary emboli. No hyperdense crescent involving
the aorta on the noncontrast study. Adequate contrast opacification
of the thoracic aorta with no evidence of dissection, aneurysm, or
stenosis. There is classic 3-vessel brachiocephalic arch anatomy
without proximal stenosis. No significant atheromatous plaque.

Mediastinum/Nodes: No hilar or mediastinal adenopathy.

Lungs/Pleura: No pleural effusion. No pneumothorax. Minimal
dependent atelectasis posteriorly in both lower lobes. Lungs
otherwise clear.

Musculoskeletal: No chest wall abnormality. No acute or significant
osseous findings.

Review of the MIP images confirms the above findings.

CTA ABDOMEN AND PELVIS FINDINGS

VASCULAR

Aorta: Normal caliber aorta without aneurysm, dissection, vasculitis
or significant stenosis.

Celiac: Patent without evidence of aneurysm, dissection, vasculitis
or significant stenosis.

SMA: Patent without evidence of aneurysm, dissection, vasculitis or
significant stenosis.

Renals: Both renal arteries are patent without evidence of aneurysm,
dissection, vasculitis, fibromuscular dysplasia or significant
stenosis.

IMA: Patent without evidence of aneurysm, dissection, vasculitis or
significant stenosis.

Inflow: Patent without evidence of aneurysm, dissection, vasculitis
or significant stenosis.

Veins: No obvious venous abnormality within the limitations of this
arterial phase study.

Review of the MIP images confirms the above findings.

NON-VASCULAR

Hepatobiliary: Unremarkable liver. At least 2 partially calcified
stones measuring over 1 cm in the dependent aspect of the nondilated
gallbladder. No biliary ductal dilatation.

Pancreas: Unremarkable. No pancreatic ductal dilatation or
surrounding inflammatory changes.

Spleen: Normal in size without focal abnormality.

Adrenals/Urinary Tract: Adrenal glands are unremarkable. Kidneys are
normal, without renal calculi, focal lesion, or hydronephrosis.
Bladder is unremarkable.

Stomach/Bowel: Stomach is physiologically distended. The small bowel
is nondilated. Normal appendix. Colon is nondilated, unremarkable.

Lymphatic: No abdominal or pelvic adenopathy.

Reproductive: Prostatic enlargement

Other: No ascites.  No free air.

Musculoskeletal: Degenerative disc disease L4-5, L5-S1. No fracture
or worrisome bone lesion.

Review of the MIP images confirms the above findings.
IMPRESSION: 1. Negative for aortic dissection or other acute vascular finding.
2. Cholelithiasis without other CT evidence of cholecystitis or
biliary obstruction.
3. Lumbar spondylitic changes
